# Patient Record
Sex: Female | Born: 1980 | Hispanic: No | Marital: Single | Smoking: Never smoker
Health system: Southern US, Community
[De-identification: ages and names within clinical notes are randomized; demographics above are authoritative.]

## PROBLEM LIST (undated history)

## (undated) DIAGNOSIS — D649 Anemia, unspecified: Secondary | ICD-10-CM

## (undated) HISTORY — DX: Anemia, unspecified: D64.9

---

## 2014-10-15 ENCOUNTER — Encounter: Payer: Self-pay | Admitting: Medical

## 2014-10-15 ENCOUNTER — Ambulatory Visit (INDEPENDENT_AMBULATORY_CARE_PROVIDER_SITE_OTHER): Payer: Medicaid Other | Admitting: Medical

## 2014-10-15 VITALS — BP 102/60 | HR 80 | Temp 98.4°F | Resp 16 | Ht 61.0 in | Wt 128.0 lb

## 2014-10-15 DIAGNOSIS — D539 Nutritional anemia, unspecified: Secondary | ICD-10-CM

## 2014-10-15 DIAGNOSIS — J029 Acute pharyngitis, unspecified: Secondary | ICD-10-CM

## 2014-10-15 DIAGNOSIS — Z0001 Encounter for general adult medical examination with abnormal findings: Secondary | ICD-10-CM | POA: Diagnosis not present

## 2014-10-15 DIAGNOSIS — Z0000 Encounter for general adult medical examination without abnormal findings: Secondary | ICD-10-CM

## 2014-10-15 LAB — POCT URINALYSIS DIPSTICK
BILIRUBIN UA: NEGATIVE
Blood, UA: NEGATIVE
Glucose, UA: NEGATIVE
Ketones, UA: NEGATIVE
LEUKOCYTES UA: NEGATIVE
Nitrite, UA: NEGATIVE
PH UA: 6
PROTEIN UA: NEGATIVE
Spec Grav, UA: 1.015
Urobilinogen, UA: NEGATIVE

## 2014-10-15 NOTE — Progress Notes (Signed)
Subjective:   HPI  Jacqueline Reyes is a 33 y.o. female who presents for new patient to establish care.   From [place] originally, was in [place] in refugee camp.  Came to [place], [place] 1 month ago.  Her case worker wanted to bring her in for establishing care.  She is here in the room alone, used telephone translation resources Hotline  She brought records from her recent health department visit today with [facility] showing updated vaccines, had some blood work done as well.  She reports being healthy, in her usual state of health. Has not been receiving routine medical care in her home country or in the refugee.    Concerns: She is currently on antibiotic for a broken tooth, has a dentist visit set up  She denies current sexual activity for the last 12 years, no concern for STD, had an HIV test in [place] within the past year that was negative, no vaginal complaint, periods are regular, occasionally crampy, does have a history of anemia, has used over-the-counter Advil for pain in the past during periods, no prior pregnancy  She reports some recent sore throat without other symptoms. Apparently in her culture people have their uvula cut off for fear recurrent throat infections. She denies a history of acid reflux but has had some issue with her vocal cords in the past  Reviewed their medical, surgical, family, social, medication, and allergy history and updated chart as appropriate.  Past Medical History  Diagnosis Date  . Anemia     History reviewed. No pertinent past surgical history.  History   Social History  . Marital Status: Single    Spouse Name: N/A    Number of Children: N/A  . Years of Education: N/A   Occupational History  . Not on file.   Social History Main Topics  . Smoking status: Never Smoker   . Smokeless tobacco: Not on file  . Alcohol Use: No  . Drug Use: No  . Sexual Activity: Not on file   Other Topics Concern  . Not on file   Social  History Narrative  . No narrative on file    Family History  Problem Relation Age of Onset  . Cancer Neg Hx   . Diabetes Neg Hx   . Heart disease Neg Hx   . Hyperlipidemia Neg Hx   . Stroke Neg Hx     Current outpatient prescriptions: amoxicillin (AMOXIL) 500 MG capsule, Take 500 mg by mouth 4 (four) times daily., Disp: , Rfl:   No Known Allergies    Review of Systems Constitutional: -fever, -chills, -sweats, -unexpected weight change, -decreased appetite, -fatigue Allergy: -sneezing, -itching, -congestion Dermatology: -changing moles, --rash, -lumps ENT: -runny nose, -ear pain, -sore throat, -hoarseness, -sinus pain, -teeth pain, - ringing in ears, -hearing loss, -nosebleeds Cardiology: -chest pain, -palpitations, -swelling, -difficulty breathing when lying flat, -waking up short of breath Respiratory: -cough, -shortness of breath, -difficulty breathing with exercise or exertion, -wheezing, -coughing up blood Gastroenterology: -abdominal pain, -nausea, -vomiting, -diarrhea, -constipation, -blood in stool, -changes in bowel movement, -difficulty swallowing or eating Hematology: -bleeding, -bruising  Musculoskeletal: -joint aches, -muscle aches, -joint swelling, -back pain, -neck pain, -cramping, -changes in gait Ophthalmology: denies vision changes, eye redness, itching, discharge Urology: -burning with urination, -difficulty urinating, -blood in urine, -urinary frequency, -urgency, -incontinence Neurology: -headache, -weakness, -tingling, -numbness, -memory loss, -falls, -dizziness Psychology: -depressed mood, -agitation, -sleep problems     Objective:   Physical Exam  BP 102/60 mmHg  Pulse 80  Temp(Src) 98.4 F (36.9 C) (Oral)  Resp 16  Ht 5\' 1"  (1.549 m)  Wt 128 lb (58.06 kg)  BMI 24.20 kg/m2  General appearance: alert, no distress, WD/WN. [place] female Skin: Scattered macules, tattoos right dorsal hand, tattoo left forearm, no worrisome lesions HEENT:  normocephalic, conjunctiva/corneas normal, sclerae anicteric, PERRLA, EOMi, nares patent, no discharge or erythema, pharynx normal Oral cavity: MMM, tongue normal no obvious abscess or decay, some stain present Neck: supple, no lymphadenopathy, no thyromegaly, no masses, normal ROM, no bruits Chest: non tender, normal shape and expansion Heart: RRR, normal S1, S2, no murmurs Lungs: CTA bilaterally, no wheezes, rhonchi, or rales Abdomen: +bs, soft, non tender, non distended, no masses, no hepatomegaly, no splenomegaly, no bruits Back: non tender, normal ROM, no scoliosis Musculoskeletal: upper extremities non tender, no obvious deformity, normal ROM throughout, lower extremities non tender, no obvious deformity, normal ROM throughout Extremities: no edema, no cyanosis, no clubbing Pulses: 2+ symmetric, upper and lower extremities, normal cap refill Neurological: alert, oriented x 3, CN2-12 intact, strength normal upper extremities and lower extremities, sensation normal throughout, DTRs 2+ throughout, no cerebellar signs, gait normal Psychiatric: normal affect, behavior normal, pleasant  Breast/gyn/rectal - deferred   Assessment and Plan :     Encounter Diagnoses  Name Primary?  . Encounter for health maintenance examination in adult Yes  . Deficiency anemia   . Sore throat     Physical exam - discussed healthy lifestyle, diet, exercise, preventative care, vaccinations, and addressed their concerns.  Handout given. I reviewed her vaccinations and lab orders from the health department from today's visit We discussed our role here is primary care, screening, well visits, acute and sick visits.   Will await other lab results from health dept.   Sore throat - discussed supportive care, likely viral infection, call or return if worse or not improving by the end of the week She can return at her convenience for Pap smear and routine gynecological exam or referral to gynecology at her  request Follow-up prn

## 2014-10-24 ENCOUNTER — Encounter: Payer: Self-pay | Admitting: Medical

## 2014-10-24 ENCOUNTER — Ambulatory Visit (INDEPENDENT_AMBULATORY_CARE_PROVIDER_SITE_OTHER): Payer: Medicaid Other | Admitting: Medical

## 2014-10-24 VITALS — BP 110/70 | HR 76 | Temp 98.3°F | Resp 15 | Wt 130.0 lb

## 2014-10-24 DIAGNOSIS — J312 Chronic pharyngitis: Secondary | ICD-10-CM

## 2014-10-24 DIAGNOSIS — J029 Acute pharyngitis, unspecified: Secondary | ICD-10-CM

## 2014-10-24 DIAGNOSIS — R070 Pain in throat: Secondary | ICD-10-CM

## 2014-10-24 DIAGNOSIS — G8929 Other chronic pain: Secondary | ICD-10-CM

## 2014-10-24 MED ORDER — AZITHROMYCIN 250 MG PO TABS: ORAL_TABLET | ORAL | Status: DC

## 2014-10-24 MED ORDER — OMEPRAZOLE 40 MG PO CPDR: 40.0000 mg | DELAYED_RELEASE_CAPSULE | Freq: Every day | ORAL | Status: DC

## 2014-10-24 NOTE — Progress Notes (Signed)
Subjective:  Jacqueline Reyes is a 33 y.o. female who presents for evaluation of sore throat.  She just established here for primary care recently.  Here with her friend Jacqueline Reyes who is interpreting.    She is here today for acute on chronic sore throat infection.  She notes that she has had 4-5 throat infections in the past year that requires antibiotics. This is a frequent occurrence for her. She is not sure if she tested positive for strep infection in the past.  She just came here from [place] from refugee camp.  She established here recently for primary care.  She notes that her trend is usually to have a few days of sore throat, then gets much sicker days later.  She notes several day hx/o sore throat, ear pain, low grade fever.  She denies cough, NVD, no SOB, no lympoh nodes swollen.  Has some headache.   She also notes again that in [place] and [place] the custom is to cut off the Uvula, but she wouldn't let any doctors do this.    She denies hx/o GERD ,but does eat spicy foods.  The sore throat can be any time of day.    She has not had a recent close exposure to someone with proven streptococcal pharyngitis.   She is currently taking Amoxicillin per dentist for tooth infection.  No sick contacts.  No other aggravating or relieving factors.  No other c/o.  The following portions of the patient's history were reviewed and updated as appropriate: allergies, current medications, past medical history, past social history, past surgical history and problem list.  ROS as in subjective   Objective: Filed Vitals:   10/24/14 1506  BP: 110/70  Pulse: 76  Temp: 98.3 F (36.8 C)  Resp: 15   General appearance: no distress, WD/WN HEENT: normocephalic, conjunctiva/corneas normal, sclerae anicteric, nares patent, no discharge or erythema, pharynx with mild erythema,there seems to be froth in the pharynx, but no tonsillar exudate.  Oral cavity: MMM, no lesions  Neck: supple, shoddy tender  anterior nodes, no thyromegaly Heart: RRR, normal S1, S2, no murmurs Lungs: CTA bilaterally, no wheezes, rhonchi, or rales Abdomen: +bs, soft, non tender, non distended, no masses, no hepatomegaly, no splenomegaly    Assessment and Plan: Encounter Diagnoses  Name Primary?  . Sore throat Yes  . Chronic throat pain   . Pharyngitis, chronic    We discussed her prior and current symptoms which suggest recurrent pharyngitis, recurrent tonsillitis, or possibly GERD.   She will finish current course of Amoxicillin.   Begin Omeprazole, cut back on GERD triggers.   Discussed symptomatic treatment including salt water gargles, warm fluids, rest, hydrate well, can use over-the-counter Tylenol or Ibuprofen for throat pain, fever, or malaise. If worse or not improving within 2-3 days, call or return.  We will call with strep DNA probe.

## 2014-10-25 LAB — STREP A DNA PROBE: GASP: NEGATIVE

## 2015-03-07 ENCOUNTER — Ambulatory Visit: Payer: Medicaid Other | Admitting: Family Medicine

## 2015-03-11 ENCOUNTER — Ambulatory Visit
Admission: RE | Admit: 2015-03-11 | Discharge: 2015-03-11 | Disposition: A | Discharge status: Home / Self Care | Payer: Self-pay | Source: Ambulatory Visit | Attending: Medical | Admitting: Medical

## 2015-03-11 ENCOUNTER — Ambulatory Visit (INDEPENDENT_AMBULATORY_CARE_PROVIDER_SITE_OTHER): Payer: Medicaid Other | Admitting: Medical

## 2015-03-11 ENCOUNTER — Encounter: Payer: Self-pay | Admitting: Medical

## 2015-03-11 ENCOUNTER — Ambulatory Visit: Payer: Medicaid Other | Admitting: Medical

## 2015-03-11 VITALS — BP 108/70 | HR 78 | Resp 16 | Wt 127.0 lb

## 2015-03-11 DIAGNOSIS — M546 Pain in thoracic spine: Secondary | ICD-10-CM

## 2015-03-11 DIAGNOSIS — M545 Low back pain, unspecified: Secondary | ICD-10-CM

## 2015-03-11 DIAGNOSIS — M25561 Pain in right knee: Secondary | ICD-10-CM

## 2015-03-11 DIAGNOSIS — M79671 Pain in right foot: Secondary | ICD-10-CM

## 2015-03-11 MED ORDER — CYCLOBENZAPRINE HCL 5 MG PO TABS: ORAL_TABLET | ORAL | Status: DC

## 2015-03-11 MED ORDER — NAPROXEN 375 MG PO TABS: 375.0000 mg | ORAL_TABLET | Freq: Two times a day (BID) | ORAL | Status: DC

## 2015-03-11 NOTE — Progress Notes (Signed)
Subjective: Here today for back and knee pain.   Used the Environmental education officer, interpreter [employer] [phone].    Of note, history was somewhat hard to discern as she initially noted her main concern begin back pain, but as the visit went on, each new issue such as knee or ankle or foot pain was then the most important concern, so it wasn't clear if the back and knee and foot were equally as painful or if the foot started all the issues.    Here for low back pain and burning sensation in low back x 2 weeks.   Tried to call and couldn't get visit or throughout phone line.  Denies recent injury or trauma or fall, but she notes when in [place] in refugee camp had to lift heavy things, hard work Electrical engineer.  Has had back pain underlying for a few years, but the last 2 weeks has had the new burning sensation in low back across the back in general.  Has used medication for the back in the past in [place] as well as a cream.   No imaging in the [place], but had xray in [place], was considering MRI.  No prior PT.  Working currently, but no heavy lifting.    From what I gather, she had some ankle injury in [place] and may have re injured recently with twisting funny.  Since then has had right foot and knee pain.  She doesn't specifically relate this to the back although she is guarded somewhat putting weight on the right leg.   Still is still ambulating ok and working although with pain.   She notes right foot pains under ankle daily including in the morning, some pain with right foot and ankle ROM.  Pains with anterior knee with walking, feels a click and says it seems to move out of place.   Only had swelling with fall few weeks ago, this resolved.  No other aggravating or relieving factors. No other complaint.   Objective: BP 108/70 mmHg  Pulse 78  Resp 16  Wt 127 lb (57.607 kg)  General appearance: alert, no distress, WD/WN, [place] female Skin: no erythema, no ecchymosis Abdomen: +bs, soft, non tender,  non distended, no masses, no hepatomegaly, no splenomegaly Back: tender low thoracic spine and paraspinal region in thoracic region throughout, no other lumbar or upper back tenderness, no SI joint tenderness, no scoliosis MSK: tender over right patella, pain with knee extension, mild pain with right knee ROM in general, but no swelling, no other deformity, no laxity and she is able to ambulate without obvious difficulty.  Tender over right foot mid foot inferiorly and anterior ankle, mild pain noted with ankle ROM, but no laxity, nontender otherwise, no deformity, rest of bilat leg and arm exam unremarkable Ext: no edema Legs neurovascularly intact, arms neurovascularly intact   Assessment: Encounter Diagnoses  Name Primary?  . Midline thoracic back pain Yes  . Bilateral low back pain without sciatica   . Right knee pain   . Foot pain, right      Plan: I suspect musculoskeletal thoracolumbar pain, right patellar tendonitis or patellofemoral syndrome and possible sequela from ankle sprain, but it is unclear.  Will send for xrays.   Medications today include naprosyn, flexeril and likely referral to PT.  We agree to mail her results and next steps/recommendations.   She understands and agrees with plan.  She reads english and speaks some english.

## 2015-03-12 ENCOUNTER — Encounter: Payer: Self-pay | Admitting: Medical

## 2015-03-12 ENCOUNTER — Encounter: Payer: Self-pay | Admitting: Family Medicine

## 2015-03-18 ENCOUNTER — Telehealth: Payer: Self-pay | Admitting: Family Medicine

## 2015-03-18 NOTE — Telephone Encounter (Signed)
Patient came by the office wanting a detail explanation about her xray results. I went over the xray results with her. I explain to her that we tried to refer her PT but Medicaid doesn't cover her for PT so Jacqueline Reyes wanted to refer her to Orthopedics. After, speaking to someone who could translate she understood everything and I gave her all of the appointment details. Jacqueline Reyes, [name]  03/31/15 @ 215 PM WITH DR. Durward Fortes

## 2015-04-02 ENCOUNTER — Ambulatory Visit: Payer: Medicaid Other | Attending: Orthopaedic Surgery

## 2015-04-02 DIAGNOSIS — M228X1 Other disorders of patella, right knee: Secondary | ICD-10-CM | POA: Diagnosis not present

## 2015-04-02 DIAGNOSIS — M25561 Pain in right knee: Secondary | ICD-10-CM | POA: Diagnosis present

## 2015-04-02 NOTE — Therapy (Signed)
[place], [place], [address] Phone: [phone]   Fax:  [phone]  Physical Therapy Evaluation  Patient Details  Name: Jacqueline Reyes MRN: [id number] Date of Birth: 1981/09/17 Referring Provider:  Garald Balding, MD  Encounter Date: 04/02/2015      PT End of Session - 04/02/15 1110    Visit Number 1   Number of Visits 7   Date for PT Re-Evaluation 05/02/15  Date extended as she has to decide on payment options and this may take some days to determine   Authorization Type Medicaid   Authorization - Visit Number 1   Authorization - Number of Visits 1   PT Start Time 1000   PT Stop Time 1100   PT Time Calculation (min) 60 min   Activity Tolerance Patient tolerated treatment well   Behavior During Therapy [facility] for tasks assessed/performed      Past Medical History  Diagnosis Date  . Anemia     No past surgical history on file.  There were no vitals filed for this visit.  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  She also fell 6 weeks ago  on floor   Limitations Standing;Walking;House hold activities  work as sit and standing   How long can you sit comfortably? 1 hour   How long can you stand comfortably? stand 2 hours   Diagnostic tests xray: negative   Patient Stated Goals Decreased pain   Currently in Pain? No/denies   Multiple Pain Sites No            [facility] PT Assessment - 04/02/15 1013    Assessment   Medical Diagnosis RT knee pain   Onset Date --  3 years ago   Prior Therapy No   Precautions   Precautions None   Restrictions   Weight Bearing Restrictions No   Balance Screen   Has the patient fallen in the  past 6 months Yes   How many times? 1   Has the patient had a decrease in activity level because of a fear of falling?  No   Is the patient reluctant to leave their home because of a fear of falling?  No   Prior Function   Level of Independence Independent with basic ADLs   Cognition   Overall Cognitive Status Within Functional Limits for tasks assessed   ROM / Strength   AROM / PROM / Strength AROM;Strength   AROM   AROM Assessment Site Knee   Right/Left Knee Right;Left   Right Knee Extension 0   Right Knee Flexion 180   Left Knee Extension 0   Left Knee Flexion 180   Strength   Overall Strength Comments Normal all LE tests   Palpation   Palpation Tender around RT patella with tilt of patella   Ambulation/Gait   Gait Comments Normal                           PT Education - 04/02/15 1109    Education provided Yes   Education Details POC and HEP. Also medicaid limitations and need for self pay  if she wants to continue PT. She said she would ask her sponser organization to see if they will pay for PT   Person(s) Educated Patient   Methods Explanation;Demonstration;Verbal cues;Handout   Comprehension Verbalized understanding;Returned demonstration             PT Long Term Goals - 04/02/15 1115    PT LONG TERM GOAL #1   Title Independent with all hEP issued as of last visit   Time 3   Period Weeks   Status New   PT LONG TERM GOAL #2   Title She will report pain decreased 50% with activity   Time 3   Period Weeks   Status New               Plan - 04/02/15 1111    Clinical Impression Statement She has normal strength and range with no swelling or incr temp. She does have some patella tracking issues. She may benefit from more PT but she is not able to pay. If she can get coverage she  will be seen for 6 total visits to progres HEP and try tape to help with tracking   Pt will benefit from skilled therapeutic intervention in order to improve  on the following deficits Pain   Rehab Potential Good   PT Frequency 2x / week   PT Duration 3 weeks   PT Treatment/Interventions Cryotherapy;Therapeutic exercise;Patient/family education;Manual techniques   PT Next Visit Plan Will progress exercise and try taping for patella alignment   PT Home Exercise Plan Strength quads   Consulted and Agree with Plan of Care Patient         Problem List There are no active problems to display for this patient.   Darrel Hoover PT 04/02/2015, 11:20 AM  [facility] [address] [place], [place], [address] Phone: [phone]   Fax:  [phone]

## 2015-04-02 NOTE — Patient Instructions (Signed)
Issued exercises from cabinet for SLR supine and side lye and LAQ and wall sits. All with hold time for 5-10 sec and 10-15 reps 1x/day, 2x/day on days you don't work

## 2015-04-24 ENCOUNTER — Ambulatory Visit: Payer: Medicaid Other | Attending: Orthopaedic Surgery | Admitting: Physical Therapy

## 2015-04-24 DIAGNOSIS — M228X1 Other disorders of patella, right knee: Secondary | ICD-10-CM

## 2015-04-24 DIAGNOSIS — M25561 Pain in right knee: Secondary | ICD-10-CM | POA: Diagnosis present

## 2015-04-24 NOTE — Therapy (Addendum)
[place], [place], [address] Phone: [phone]   Fax:  [phone]  Physical Therapy Treatment  Patient Details  Name: Jacqueline Reyes MRN: [id number] Date of Birth: 1980-11-30 Referring Provider:  Carlena Hurl, PA-C  Encounter Date: 04/24/2015      PT End of Session - 04/24/15 1411    Visit Number 2   Number of Visits 7   Date for PT Re-Evaluation 05/02/15   PT Start Time 0935   PT Stop Time 8416   PT Time Calculation (min) 60 min   Activity Tolerance Patient tolerated treatment well      Past Medical History  Diagnosis Date  . Anemia     No past surgical history on file.  There were no vitals filed for this visit.  Visit Diagnosis:  Pain in knee joint, right  Patellar tracking disorder of right knee      Subjective Assessment - 04/24/15 0946    Pain Location Knee   Pain Orientation Right   Pain Descriptors / Indicators Aching  pain, Rt lateral warm   Pain Type Chronic pain  3 years   Pain Radiating Towards lateral thigh   Pain Onset Other (comment)  3 years   Aggravating Factors  standing 8 hours, varicose veins type pain too   Pain Relieving Factors brace, bicycle   Multiple Pain Sites No                         [facility] Adult PT Treatment/Exercise - 04/24/15 0930    Knee/Hip Exercises: Stretches   Passive Hamstring Stretch 3 reps;30 seconds   ITB Stretch --  3 reps, 20 seconds each.  Tried 2 others sidelying,   Knee/Hip Exercises: Standing   Wall Squat 10 seconds;10 reps  cues pillow squeeze and heel press to increase comfort   Cryotherapy   Number Minutes Cryotherapy 10 Minutes   Cryotherapy Location --  knees   Type of Cryotherapy --  cold pack   Manual Therapy   McConnell --  for lateral tracking patella instruction , patient did corre                PT Education - 04/24/15 1032    Education provided Yes   Education Details how to  tape knees, IT band stretch   Person(s) Educated Patient   Methods Explanation;Demonstration;Verbal cues;Tactile cues;Handout   Comprehension Verbalized understanding;Returned demonstration             PT Long Term Goals - 04/24/15 1416    PT LONG TERM GOAL #1   Title Independent with all hEP issued as of last visit   Baseline minor cues with wall sits.   Time 3   Period Weeks   Status On-going   PT LONG TERM GOAL #2   Title She will report pain decreased 50% with activity   Time 3   Period Weeks   Status On-going               Plan - 04/24/15 1416    Consulted and Agree with Plan of Care Patient        Problem List There are no active problems to display for this patient.   [facility] 04/24/2015, 2:20 PM  [facility] [address] [place], [place], [address] Phone: [phone]   Fax:  [phone]  Melvenia Needles, PTA 04/24/2015 2:20 PM Phone: [phone] Fax: [phone]  PHYSICAL THERAPY  DISCHARGE SUMMARY  Visits from Start of Care: 2  Current functional level related to goals / functional outcomes: Unknown   Remaining deficits: Unknown   Education / Equipment: HEP Plan:                                                    Patient goals were not met. Patient is being discharged due to not returning since the last visit.  ?????   Darrel Hoover , PT   09/30/15                       2:14 PM

## 2015-07-22 ENCOUNTER — Encounter (HOSPITAL_COMMUNITY): Payer: Self-pay

## 2015-07-22 ENCOUNTER — Emergency Department (HOSPITAL_COMMUNITY)
Admission: EM | Admit: 2015-07-22 | Discharge: 2015-07-22 | Disposition: A | Discharge status: Home / Self Care | Payer: Medicaid Other | Attending: Emergency Medicine | Admitting: Emergency Medicine

## 2015-07-22 DIAGNOSIS — Z862 Personal history of diseases of the blood and blood-forming organs and certain disorders involving the immune mechanism: Secondary | ICD-10-CM | POA: Insufficient documentation

## 2015-07-22 DIAGNOSIS — Z791 Long term (current) use of non-steroidal anti-inflammatories (NSAID): Secondary | ICD-10-CM | POA: Insufficient documentation

## 2015-07-22 DIAGNOSIS — Z792 Long term (current) use of antibiotics: Secondary | ICD-10-CM | POA: Insufficient documentation

## 2015-07-22 DIAGNOSIS — H6092 Unspecified otitis externa, left ear: Secondary | ICD-10-CM | POA: Insufficient documentation

## 2015-07-22 MED ORDER — CIPROFLOXACIN-DEXAMETHASONE 0.3-0.1 % OT SUSP
4.0000 [drp] | Freq: Once | OTIC | Status: AC
  Administered 2015-07-22: 4 [drp] via OTIC
  Filled 2015-07-22: qty 7.5

## 2015-07-22 MED ORDER — IBUPROFEN 600 MG PO TABS
600.0000 mg | ORAL_TABLET | Freq: Four times a day (QID) | ORAL | Status: DC | PRN
Start: 2015-07-22 — End: 2016-06-19

## 2015-07-22 MED ORDER — TRAMADOL HCL 50 MG PO TABS: 50.0000 mg | ORAL_TABLET | Freq: Four times a day (QID) | ORAL | Status: DC | PRN

## 2015-07-22 NOTE — ED Notes (Signed)
Pt reports left ear pain and drainage that began last night.  Pt reports yellow drainage from left ear and reports the pain came on suddenly.  Pt denies going swimming or any trauma.

## 2015-07-22 NOTE — ED Notes (Signed)
Messaged pharmacy for medication 

## 2015-07-22 NOTE — Discharge Instructions (Signed)
Apply 4 drops of the eardrops twice a day. Ibuprofen and tramadol for pain. Follow-up with primary care doctor for recheck. Return if worsening  Otitis Externa Otitis externa is a bacterial or fungal infection of the outer ear canal. This is the area from the eardrum to the outside of the ear. Otitis externa is sometimes called "swimmer's ear." CAUSES  Possible causes of infection include:  Swimming in dirty water.  Moisture remaining in the ear after swimming or bathing.  Mild injury (trauma) to the ear.  Objects stuck in the ear (foreign body).  Cuts or scrapes (abrasions) on the outside of the ear. SIGNS AND SYMPTOMS  The first symptom of infection is often itching in the ear canal. Later signs and symptoms may include swelling and redness of the ear canal, ear pain, and yellowish-white fluid (pus) coming from the ear. The ear pain may be worse when pulling on the earlobe. DIAGNOSIS  Your health care provider will perform a physical exam. A sample of fluid may be taken from the ear and examined for bacteria or fungi. TREATMENT  Antibiotic ear drops are often given for 10 to 14 days. Treatment may also include pain medicine or corticosteroids to reduce itching and swelling. HOME CARE INSTRUCTIONS   Apply antibiotic ear drops to the ear canal as prescribed by your health care provider.  Take medicines only as directed by your health care provider.  If you have diabetes, follow any additional treatment instructions from your health care provider.  Keep all follow-up visits as directed by your health care provider. PREVENTION   Keep your ear dry. Use the corner of a towel to absorb water out of the ear canal after swimming or bathing.  Avoid scratching or putting objects inside your ear. This can damage the ear canal or remove the protective wax that lines the canal. This makes it easier for bacteria and fungi to grow.  Avoid swimming in lakes, polluted water, or poorly  chlorinated pools.  You may use ear drops made of rubbing alcohol and vinegar after swimming. Combine equal parts of white vinegar and alcohol in a bottle. Put 3 or 4 drops into each ear after swimming. SEEK MEDICAL CARE IF:   You have a fever.  Your ear is still red, swollen, painful, or draining pus after 3 days.  Your redness, swelling, or pain gets worse.  You have a severe headache.  You have redness, swelling, pain, or tenderness in the area behind your ear. MAKE SURE YOU:   Understand these instructions.  Will watch your condition.  Will get help right away if you are not doing well or get worse. Document Released: 11/01/2005 Document Revised: 03/18/2014 Document Reviewed: 11/18/2011 [facility] Patient Information 2015 [place], [place]. This information is not intended to replace advice given to you by your health care provider. Make sure you discuss any questions you have with your health care provider.

## 2015-07-22 NOTE — ED Provider Notes (Signed)
CSN: [id number]     Arrival date & time 07/22/15  1000 History  This chart was scribed for non-physician practitioner, Renold Genta, PA-C, working with Orpah Greek, MD, by Stephania Fragmin, ED Scribe. This patient was seen in room TR06C/TR06C and the patient's care was started at 10:42 AM.    Chief Complaint  Patient presents with  . Otalgia   The history is provided by the patient. No language interpreter was used.    HPI Comments: Jacqueline Reyes is a 34 y.o. female with a history of anemia, who presents to the Emergency Department complaining of gradual-onset, constant, severe, worsening, left ear pain and swelling that began yesterday evening. She also notes associated discharge from the ear. Patient complains of difficulty sleeping yesterday secondary to the pain. She states she has taken old hydrocodone tablets, previously prescribed for dental pain, with only temporary relief. She denies fever, sore throat, rhinorrhea, or any right ear symptoms.  Past Medical History  Diagnosis Date  . Anemia    History reviewed. No pertinent past surgical history. Family History  Problem Relation Age of Onset  . Cancer Neg Hx   . Diabetes Neg Hx   . Heart disease Neg Hx   . Hyperlipidemia Neg Hx   . Stroke Neg Hx    Social History  Substance Use Topics  . Smoking status: Never Smoker   . Smokeless tobacco: None  . Alcohol Use: No   OB History    No data available     Review of Systems  Constitutional: Negative for fever.  HENT: Positive for ear discharge and ear pain. Negative for rhinorrhea and sore throat.     Allergies  Review of patient's allergies indicates no known allergies.  Home Medications   Prior to Admission medications   Medication Sig Start Date End Date Taking? Authorizing Provider  amoxicillin (AMOXIL) 500 MG capsule Take 500 mg by mouth 4 (four) times daily.    Historical Provider, MD  cyclobenzaprine (FLEXERIL) 5 MG tablet 1 tablet either QHS prn or  up to BID for back spasm 03/11/15   Camelia Eng Tysinger, PA-C  naproxen (NAPROSYN) 375 MG tablet Take 1 tablet (375 mg total) by mouth 2 (two) times daily with a meal. 03/11/15   Camelia Eng Tysinger, PA-C  omeprazole (PRILOSEC) 40 MG capsule Take 1 capsule (40 mg total) by mouth daily. Patient not taking: Reported on 03/11/2015 10/24/14   Camelia Eng Tysinger, PA-C   BP 131/84 mmHg  Pulse 86  Temp(Src) 98 F (36.7 C) (Oral)  Resp 18  Ht 5\' 3"  (1.6 m)  Wt 125 lb (56.7 kg)  BMI 22.15 kg/m2  SpO2 100% Physical Exam  Constitutional: She is oriented to person, place, and time. She appears well-developed and well-nourished. No distress.  HENT:  Head: Normocephalic and atraumatic.  No obvious swelling to the outer helix or pinna of the left ear. Tender to palpation over tragus. Ear canal is edematous on the left. Purulent drainage. Unable to visualize TM. No tenderness of her mastoid. Normal oropharynx. Normal right ear and ear canal.  Eyes: Conjunctivae and EOM are normal.  Neck: Neck supple. No tracheal deviation present.  Cardiovascular: Normal rate, regular rhythm and normal heart sounds.   Pulmonary/Chest: Effort normal and breath sounds normal. No respiratory distress. She has no wheezes. She has no rales.  Musculoskeletal: Normal range of motion.  Neurological: She is alert and oriented to person, place, and time.  Skin: Skin is warm and dry.  Psychiatric: She has a normal mood and affect. Her behavior is normal.  Nursing note and vitals reviewed.   ED Course  Procedures (including critical care time)  DIAGNOSTIC STUDIES: Oxygen Saturation is 100% on RA, normal by my interpretation.    COORDINATION OF CARE: 10:45 AM - Suspect otitis externa. Discussed treatment plan with pt at bedside which includes application of ear wick with antibiotics, and discharge home with Rx antibiotic ear drops. Will give work note. Pt verbalized understanding and agreed to plan.   MDM   Final diagnoses:   Otitis externa, left   Patient with left ear canal swelling and purulent drainage. No evidence of ear infection or cellulitis. No tenderness of her mastoid. She is afebrile, normal vital signs, nontoxic appearing. No evidence of malignant otitis externa. She is nondiabetic. Ear wick placed, Ciprodex administered in emergency department. We'll discharge home with ear drops and follow-up as needed. Tramadol to provide for pain.  Filed Vitals:   07/22/15 1005 07/22/15 1144  BP: 131/84 117/82  Pulse: 86 83  Temp: 98 F (36.7 C) 98.9 F (37.2 C)  TempSrc: Oral Oral  Resp: 18 16  Height: 5\' 3"  (1.6 m)   Weight: 125 lb (56.7 kg)   SpO2: 100% 100%    I personally performed the services described in this documentation, which was scribed in my presence. The recorded information has been reviewed and is accurate.      Jeannett Senior, PA-C 07/22/15 [place], MD 07/22/15 1239

## 2015-07-29 ENCOUNTER — Telehealth: Payer: Self-pay | Admitting: Medical

## 2015-07-29 ENCOUNTER — Telehealth: Payer: Self-pay | Admitting: Family Medicine

## 2015-07-29 ENCOUNTER — Emergency Department (HOSPITAL_COMMUNITY)
Admission: EM | Admit: 2015-07-29 | Discharge: 2015-07-29 | Disposition: A | Discharge status: Home / Self Care | Payer: Medicaid Other | Attending: Emergency Medicine | Admitting: Emergency Medicine

## 2015-07-29 ENCOUNTER — Encounter (HOSPITAL_COMMUNITY): Payer: Self-pay | Admitting: Emergency Medicine

## 2015-07-29 DIAGNOSIS — Z791 Long term (current) use of non-steroidal anti-inflammatories (NSAID): Secondary | ICD-10-CM | POA: Insufficient documentation

## 2015-07-29 DIAGNOSIS — Z862 Personal history of diseases of the blood and blood-forming organs and certain disorders involving the immune mechanism: Secondary | ICD-10-CM | POA: Insufficient documentation

## 2015-07-29 DIAGNOSIS — H6092 Unspecified otitis externa, left ear: Secondary | ICD-10-CM | POA: Insufficient documentation

## 2015-07-29 DIAGNOSIS — Z792 Long term (current) use of antibiotics: Secondary | ICD-10-CM | POA: Insufficient documentation

## 2015-07-29 NOTE — Telephone Encounter (Signed)
ED letter sent

## 2015-07-29 NOTE — Telephone Encounter (Signed)
Please get in touch with the patient about recent visit to the emergency department.  Remind them to use [place] first for non emergency issues.  The emergency dept is expensive, should be used primarily for emergencies, and they should be encouraged to come here for non emergency problems.  This saves every body money, saves them time, and is better for continuity of care.  If they are ever unsure, they can always call here first, unless after hours or weekends.    This is twice recently Jacqueline Reyes went to ED for some thing non emergent, and today we had plenty of openings. If Jacqueline Reyes does this again, Jacqueline Reyes will be dismissed from our practice.

## 2015-07-29 NOTE — ED Provider Notes (Signed)
CSN: [id number]     Arrival date & time 07/29/15  4680 History  This chart was scribed for non-physician practitioner, Zacarias Pontes, PA-C working with Nat Christen, MD, by Erling Conte, ED Scribe. This patient was seen in room TR06C/TR06C and the patient's care was started at 10:16 AM.      Chief Complaint  Patient presents with  . Follow-up    The history is provided by the patient. No language interpreter was used.    HPI Comments: Jacqueline Reyes is a 34 y.o. female with a h/o anemia who presents to the Emergency Department for a follow up of her left ear infection. Pt was seen 7 days ago on 07/22/15 and an ear wick was placed in her left ear. At the visit on 07/22/15 pt was having left ear pain, swelling and discharge from her left ear. She was prescribed Ibuprofen, tramadol and Ciprodex which she has been compliant with. She denies any continued pain or drainage from the ear. She denies any rhinorrhea, sore throat, fever, chills, chest pain, SOB, difficulty urinating, dysuria, hematuria, numbness, tingling, weakness, myalgias, or rashes. She is here for ear wick removal.    Past Medical History  Diagnosis Date  . Anemia    History reviewed. No pertinent past surgical history. Family History  Problem Relation Age of Onset  . Cancer Neg Hx   . Diabetes Neg Hx   . Heart disease Neg Hx   . Hyperlipidemia Neg Hx   . Stroke Neg Hx    Social History  Substance Use Topics  . Smoking status: Never Smoker   . Smokeless tobacco: None  . Alcohol Use: No   OB History    No data available     Review of Systems  Constitutional: Negative for fever and chills.  HENT: Negative for ear discharge, ear pain, rhinorrhea and sore throat.   Respiratory: Negative for shortness of breath.   Cardiovascular: Negative for chest pain.  Gastrointestinal: Negative for nausea, vomiting, abdominal pain, diarrhea and constipation.  Genitourinary: Negative for dysuria, hematuria and difficulty  urinating.  Musculoskeletal: Negative for myalgias and arthralgias.  Skin: Negative for rash.  Allergic/Immunologic: Negative for immunocompromised state.  Neurological: Negative for weakness and numbness.  10 Systems reviewed and are negative for acute change except as noted in the HPI.     Allergies  Review of patient's allergies indicates no known allergies.  Home Medications   Prior to Admission medications   Medication Sig Start Date End Date Taking? Authorizing Provider  amoxicillin (AMOXIL) 500 MG capsule Take 500 mg by mouth 4 (four) times daily.    Historical Provider, MD  cyclobenzaprine (FLEXERIL) 5 MG tablet 1 tablet either QHS prn or up to BID for back spasm 03/11/15   Camelia Eng Tysinger, PA-C  ibuprofen (ADVIL,MOTRIN) 600 MG tablet Take 1 tablet (600 mg total) by mouth every 6 (six) hours as needed. 07/22/15   Tatyana Kirichenko, PA-C  naproxen (NAPROSYN) 375 MG tablet Take 1 tablet (375 mg total) by mouth 2 (two) times daily with a meal. 03/11/15   Camelia Eng Tysinger, PA-C  omeprazole (PRILOSEC) 40 MG capsule Take 1 capsule (40 mg total) by mouth daily. Patient not taking: Reported on 03/11/2015 10/24/14   Camelia Eng Tysinger, PA-C  traMADol (ULTRAM) 50 MG tablet Take 1 tablet (50 mg total) by mouth every 6 (six) hours as needed. 07/22/15   Tatyana Kirichenko, PA-C   BP 130/81 mmHg  Pulse 71  Temp(Src) 97.6 F (36.4 C) (Oral)  Resp 16  SpO2 100%  LMP 07/28/2015 Physical Exam  Constitutional: She is oriented to person, place, and time. Vital signs are normal. She appears well-developed and well-nourished.  Non-toxic appearance. No distress.  Afebrile, nontoxic, NAD  HENT:  Head: Normocephalic and atraumatic.  Right Ear: Tympanic membrane and ear canal normal.  Left Ear: Tympanic membrane and ear canal normal.  Mouth/Throat: Mucous membranes are normal.  Left Ear with ear wick, after removal canal with no drainage, erythema or swelling. TM clear Right ear clear  Eyes:  Conjunctivae and EOM are normal. Right eye exhibits no discharge. Left eye exhibits no discharge.  Neck: Normal range of motion. Neck supple.  Cardiovascular: Normal rate.   Pulmonary/Chest: Effort normal. No respiratory distress.  Abdominal: Normal appearance. She exhibits no distension.  Musculoskeletal: Normal range of motion.  Neurological: She is alert and oriented to person, place, and time. She has normal strength. No sensory deficit.  Skin: Skin is warm, dry and intact. No rash noted.  Psychiatric: She has a normal mood and affect. Her behavior is normal.  Nursing note and vitals reviewed.   ED Course  FOREIGN BODY REMOVAL Date/Time: 07/29/2015 10:17 AM Performed by: Shann Medal Autumn Pruitt Authorized by: Zacarias Pontes Consent: Verbal consent obtained. Risks and benefits: risks, benefits and alternatives were discussed Consent given by: patient Patient understanding: patient states understanding of the procedure being performed Patient consent: the patient's understanding of the procedure matches consent given Patient identity confirmed: verbally with patient Body area: ear Location details: left ear Patient sedated: no Patient restrained: no Patient cooperative: yes Localization method: ENT speculum Removal mechanism: forceps Complexity: simple 1 objects recovered. Objects recovered: ear wick Post-procedure assessment: foreign body removed Patient tolerance: Patient tolerated the procedure well with no immediate complications   (including critical care time) Labs Review Labs Reviewed - No data to display  Imaging Review No results found. I have personally reviewed and evaluated these images and lab results as part of my medical decision-making.   EKG Interpretation None      MDM   Final diagnoses:  Otitis externa, left    34 y.o. female here for earwick removal. Removed without issue. Otitis externa resolved. Will have her f/up with PCP as  needed. I explained the diagnosis and have given explicit precautions to return to the ER including for any other new or worsening symptoms. The patient understands and accepts the medical plan as it's been dictated and I have answered their questions. Discharge instructions concerning home care and prescriptions have been given. The patient is STABLE and is discharged to home in good condition.   I personally performed the services described in this documentation, which was scribed in my presence. The recorded information has been reviewed and is accurate.  BP 130/81 mmHg  Pulse 71  Temp(Src) 97.6 F (36.4 C) (Oral)  Resp 16  SpO2 100%  LMP 07/28/2015  No orders of the defined types were placed in this encounter.       Dewitt Hoes Camprubi-Soms, PA-C 07/29/15 [place], MD 07/29/15 1247

## 2015-07-29 NOTE — Discharge Instructions (Signed)
Finish your antibiotic drops today. Use your pain medications as needed. Follow up with your regular doctor as needed.    Ear Drops You have been diagnosed with a condition requiring you to put drops of medicine into your outer ear. HOME CARE INSTRUCTIONS   Put drops in the affected ear as instructed. After putting the drops in, you will need to lie down with the affected ear facing up for ten minutes so the drops will remain in the ear canal and run down and fill the canal. Continue using the ear drops for as long as directed by your health care provider.  Prior to getting up, put a cotton ball gently in your ear canal. Leave enough of the cotton ball out so it can be easily removed. Do not attempt to push this down into the canal with a cotton-tipped swab or other instrument.  Do not irrigate or wash out your ears if you have had a perforated eardrum or mastoid surgery, or unless instructed to do so by your health care provider.  Keep appointments with your health care provider as instructed.  Finish all medicine, or use for the length of time prescribed by your health care provider. Continue the drops even if your problem seems to be doing well after a couple days, or continue as instructed. SEEK MEDICAL CARE IF:  You become worse or develop increasing pain.  You notice any unusual drainage from your ear (particularly if the drainage has a bad smell).  You develop hearing difficulties.  You experience a serious form of dizziness in which you feel as if the room is spinning, and you feel nauseated (vertigo).  The outside of your ear becomes red or swollen or both. This may be a sign of an allergic reaction. MAKE SURE YOU:   Understand these instructions.  Will watch your condition.  Will get help right away if you are not doing well or get worse. Document Released: 10/26/2001 Document Revised: 11/06/2013 Document Reviewed: 05/29/2013 [facility] Patient Information 2015 [place],  [place]. This information is not intended to replace advice given to you by your health care provider. Make sure you discuss any questions you have with your health care provider.  Otitis Externa Otitis externa is a bacterial or fungal infection of the outer ear canal. This is the area from the eardrum to the outside of the ear. Otitis externa is sometimes called "swimmer's ear." CAUSES  Possible causes of infection include:  Swimming in dirty water.  Moisture remaining in the ear after swimming or bathing.  Mild injury (trauma) to the ear.  Objects stuck in the ear (foreign body).  Cuts or scrapes (abrasions) on the outside of the ear. SIGNS AND SYMPTOMS  The first symptom of infection is often itching in the ear canal. Later signs and symptoms may include swelling and redness of the ear canal, ear pain, and yellowish-white fluid (pus) coming from the ear. The ear pain may be worse when pulling on the earlobe. DIAGNOSIS  Your health care provider will perform a physical exam. A sample of fluid may be taken from the ear and examined for bacteria or fungi. TREATMENT  Antibiotic ear drops are often given for 10 to 14 days. Treatment may also include pain medicine or corticosteroids to reduce itching and swelling. HOME CARE INSTRUCTIONS   Apply antibiotic ear drops to the ear canal as prescribed by your health care provider.  Take medicines only as directed by your health care provider.  If you have  diabetes, follow any additional treatment instructions from your health care provider.  Keep all follow-up visits as directed by your health care provider. PREVENTION   Keep your ear dry. Use the corner of a towel to absorb water out of the ear canal after swimming or bathing.  Avoid scratching or putting objects inside your ear. This can damage the ear canal or remove the protective wax that lines the canal. This makes it easier for bacteria and fungi to grow.  Avoid swimming in lakes,  polluted water, or poorly chlorinated pools.  You may use ear drops made of rubbing alcohol and vinegar after swimming. Combine equal parts of white vinegar and alcohol in a bottle. Put 3 or 4 drops into each ear after swimming. SEEK MEDICAL CARE IF:   You have a fever.  Your ear is still red, swollen, painful, or draining pus after 3 days.  Your redness, swelling, or pain gets worse.  You have a severe headache.  You have redness, swelling, pain, or tenderness in the area behind your ear. MAKE SURE YOU:   Understand these instructions.  Will watch your condition.  Will get help right away if you are not doing well or get worse. Document Released: 11/01/2005 Document Revised: 03/18/2014 Document Reviewed: 11/18/2011 [facility] Patient Information 2015 [place], [place]. This information is not intended to replace advice given to you by your health care provider. Make sure you discuss any questions you have with your health care provider.

## 2015-07-29 NOTE — ED Notes (Signed)
Pt was seen here 9/6 for left ear infection.Returns today for wick removal.

## 2015-12-29 ENCOUNTER — Encounter: Payer: Self-pay | Admitting: Medical

## 2016-06-19 ENCOUNTER — Ambulatory Visit (HOSPITAL_COMMUNITY)
Admission: EM | Admit: 2016-06-19 | Discharge: 2016-06-19 | Disposition: A | Discharge status: Home / Self Care | Payer: BLUE CROSS/BLUE SHIELD | Attending: Emergency Medicine | Admitting: Emergency Medicine

## 2016-06-19 ENCOUNTER — Ambulatory Visit (INDEPENDENT_AMBULATORY_CARE_PROVIDER_SITE_OTHER): Payer: BLUE CROSS/BLUE SHIELD

## 2016-06-19 DIAGNOSIS — M6752 Plica syndrome, left knee: Secondary | ICD-10-CM

## 2016-06-19 DIAGNOSIS — M2242 Chondromalacia patellae, left knee: Secondary | ICD-10-CM | POA: Diagnosis not present

## 2016-06-19 MED ORDER — DICLOFENAC SODIUM 75 MG PO TBEC: 75.0000 mg | DELAYED_RELEASE_TABLET | Freq: Two times a day (BID) | ORAL | 0 refills | Status: DC | PRN

## 2016-06-19 NOTE — ED Provider Notes (Signed)
[place]    CSN: [id number] Arrival date & time: 06/19/16  1201  First Provider Contact:  First MD Initiated Contact with Patient 06/19/16 1310        History   Chief Complaint Chief Complaint  Patient presents with  . Knee Pain    HPI Jacqueline Reyes is a 35 y.o. female.   HPI  Patient presents today with left knee pain.  States that pain was sudden onset yesterday.  Denies specific injury.  Has had some chronic popping/grinding PF joint but no pain before onset.  Aggravated with stairs, squatting.  Denies any true mechanical symptoms.  Has had formal PT for both knees due to question of right knee patellofemoral instability.  No knee swelling.  No lumbar spine, hip or radicular component. Has taken ibuprofen without much relief. Denies fever, chills.   Past Medical History:  Diagnosis Date  . Anemia     There are no active problems to display for this patient.   No past surgical history on file.  OB History    No data available       Home Medications    Prior to Admission medications   Medication Sig Start Date End Date Taking? Authorizing Provider  amoxicillin (AMOXIL) 500 MG capsule Take 500 mg by mouth 4 (four) times daily.    Historical Provider, MD  cyclobenzaprine (FLEXERIL) 5 MG tablet 1 tablet either QHS prn or up to BID for back spasm 03/11/15   Camelia Eng Tysinger, PA-C  ibuprofen (ADVIL,MOTRIN) 600 MG tablet Take 1 tablet (600 mg total) by mouth every 6 (six) hours as needed. 07/22/15   Tatyana Kirichenko, PA-C  naproxen (NAPROSYN) 375 MG tablet Take 1 tablet (375 mg total) by mouth 2 (two) times daily with a meal. 03/11/15   Camelia Eng Tysinger, PA-C  omeprazole (PRILOSEC) 40 MG capsule Take 1 capsule (40 mg total) by mouth daily. Patient not taking: Reported on 03/11/2015 10/24/14   Camelia Eng Tysinger, PA-C  traMADol (ULTRAM) 50 MG tablet Take 1 tablet (50 mg total) by mouth every 6 (six) hours as needed. 07/22/15   Jeannett Senior, PA-C    Family  History Family History  Problem Relation Age of Onset  . Cancer Neg Hx   . Diabetes Neg Hx   . Heart disease Neg Hx   . Hyperlipidemia Neg Hx   . Stroke Neg Hx     Social History Social History  Substance Use Topics  . Smoking status: Never Smoker  . Smokeless tobacco: Not on file  . Alcohol use No     Allergies   Review of patient's allergies indicates no known allergies.   Review of Systems Review of Systems  Constitutional: Negative.   HENT: Negative.   Eyes: Negative.   Respiratory: Negative.   Cardiovascular: Negative.   Gastrointestinal: Negative.   Genitourinary: Negative.   Skin: Negative.   Neurological: Negative.   Psychiatric/Behavioral: Negative.      Physical Exam Triage Vital Signs ED Triage Vitals [06/19/16 1254]  Enc Vitals Group     BP 118/79     Pulse Rate 64     Resp 12     Temp 97.3 F (36.3 C)     Temp Source Oral     SpO2 100 %     Weight      Height      Head Circumference      Peak Flow      Pain Score  Pain Loc      Pain Edu?      Excl. in [place]?    No data found.   Updated Vital Signs BP 118/79 (BP Location: Right Arm)   Pulse 64   Temp 97.3 F (36.3 C) (Oral)   Resp 12   SpO2 100%   Visual Acuity Right Eye Distance:   Left Eye Distance:   Bilateral Distance:    Right Eye Near:   Left Eye Near:    Bilateral Near:     Physical Exam  Constitutional: She is oriented to person, place, and time. She appears well-developed and well-nourished.  HENT:  Head: Normocephalic and atraumatic.  Eyes: EOM are normal. Pupils are equal, round, and reactive to light.  Pulmonary/Chest: Effort normal.  Abdominal: She exhibits no distension.  Musculoskeletal: She exhibits no deformity.  Gait normal.  Neg log roll bilat hips.  Neg SLR.  bilat knees good ROM, with some PF crepitus.  Left knee + patellar grind and apprehension.  Neg mcmurray.  Cruciate and collateral ligaments stable.  Minimal swelling with out effusion.  No  signs of infection. Tender medial plica.  bilat calves nontender,   Neurological: She is oriented to person, place, and time.  Skin: Skin is warm and dry.  Psychiatric: She has a normal mood and affect.     UC Treatments / Results  Labs (all labs ordered are listed, but only abnormal results are displayed) Labs Reviewed - No data to display  EKG  EKG Interpretation None       Radiology Dg Knee Ap/lat W/sunrise Left  Result Date: 06/19/2016 CLINICAL DATA:  Pain over LEFT knee EXAM: LEFT KNEE 3 VIEWS COMPARISON:  None. FINDINGS: No fracture of the proximal tibia or distal femur. Patella is normal. No joint effusion. IMPRESSION: No fracture or dislocation. Electronically Signed   By: Suzy Bouchard M.D.   On: 06/19/2016 14:05   I reviewed xray.  Patient does have some patellofemoral degenerative changes.  Small spur superior patella.   Procedures Procedures (including critical care time)  Medications Ordered in UC Medications - No data to display   Initial Impression / Assessment and Plan / UC Course  I have reviewed the triage vital signs and the nursing notes.  Pertinent labs & imaging results that were available during my care of the patient were reviewed by me and considered in my medical decision making (see chart for details).  Clinical Course      Final Clinical Impressions(s) / UC Diagnoses   Final diagnoses:  Chondromalacia of left patellofemoral joint  Plica of knee, left    New Prescriptions New Prescriptions   DICLOFENAC (VOLTAREN) 75 MG EC TABLET    Take 1 tablet (75 mg total) by mouth 2 (two) times daily as needed (must take with food.).  xrays reviewed with patient.  Will discontinue any other anti-inflammatory med while taking voltaren.  F/u with [facility] Dr Anderson [place] in 1 week for recheck.  Will likely need to restart formal PT.  Discussed possible left knee intra-articular marc/depo injection if she continues to be symptomatic.   Avoid squatting, running, stairs if possible.  All questions answered.     Lanae Crumbly, PA-C 06/19/16 1427

## 2016-06-19 NOTE — ED Triage Notes (Signed)
Assessment per PA 

## 2016-08-10 ENCOUNTER — Ambulatory Visit (INDEPENDENT_AMBULATORY_CARE_PROVIDER_SITE_OTHER): Payer: BLUE CROSS/BLUE SHIELD | Admitting: Medical

## 2016-08-10 ENCOUNTER — Encounter: Payer: Self-pay | Admitting: Medical

## 2016-08-10 VITALS — BP 104/70 | HR 86 | Temp 98.0°F | Ht 63.0 in | Wt 132.0 lb

## 2016-08-10 DIAGNOSIS — R6883 Chills (without fever): Secondary | ICD-10-CM

## 2016-08-10 DIAGNOSIS — J988 Other specified respiratory disorders: Secondary | ICD-10-CM | POA: Diagnosis not present

## 2016-08-10 DIAGNOSIS — R52 Pain, unspecified: Secondary | ICD-10-CM

## 2016-08-10 DIAGNOSIS — R059 Cough, unspecified: Secondary | ICD-10-CM

## 2016-08-10 DIAGNOSIS — R05 Cough: Secondary | ICD-10-CM | POA: Diagnosis not present

## 2016-08-10 LAB — POC INFLUENZA A&B (BINAX/QUICKVUE)
INFLUENZA A, POC: NEGATIVE
Influenza B, POC: NEGATIVE

## 2016-08-10 MED ORDER — CLARITHROMYCIN 500 MG PO TABS: 500.0000 mg | ORAL_TABLET | Freq: Two times a day (BID) | ORAL | 0 refills | Status: DC

## 2016-08-10 MED ORDER — PROMETHAZINE-DM 6.25-15 MG/5ML PO SYRP: 5.0000 mL | ORAL_SOLUTION | Freq: Four times a day (QID) | ORAL | 0 refills | Status: DC | PRN

## 2016-08-10 NOTE — Progress Notes (Signed)
Subjective: Chief Complaint  Patient presents with  . Cough    x2 days, phlegm-dark brown/black, weakness    Here for illness  [employer]  Patient presents today for illness.  She says I have "flu", feel it all over, achy, sputum.  No fever, no ear pain, no sore throat.  No SOB.  No NVD.  Using ibuprofen for symptoms.  Last night had sweats.   No hx/o TB.  2015 when she came to this country had negative TB screen reportedly.  Missed work yesterday and today.   Got written up for missing work.  No sick contacts.  No other aggravating or relieving factors. No other complaint.   Past Medical History:  Diagnosis Date  . Anemia    ROS as in subjective  Objective: BP 104/70 (BP Location: Right Arm, Patient Position: Sitting, Cuff Size: Normal)   Pulse 86   Temp 98 F (36.7 C) (Oral)   Ht 5\' 3"  (1.6 m)   Wt 132 lb (59.9 kg)   SpO2 99%   BMI 23.38 kg/m   General appearance: Alert, WD/WN, no distress, ill appearing                             Skin: warm, no rash, no diaphoresis                           Head: no sinus tenderness                            Eyes: conjunctiva normal, corneas clear, PERRLA                            Ears: pearly TMs, external ear canals normal                          Nose: septum midline, turbinates swollen, with erythema and clear discharge             Mouth/throat: MMM, tongue normal, mild pharyngeal erythema                           Neck: supple, no adenopathy, no thyromegaly, non tender                          Heart: RRR, normal S1, S2, no murmurs                         Lungs: +bronchial breath sounds, +scattered rhonchi, no wheezes, no rales                Extremities: no edema, non tender   Assessment: Encounter Diagnoses  Name Primary?  Marland Kitchen Respiratory tract infection Yes  . Chills   . Cough   . Body aches     Plan: Flu negative swab.  Begin medications below, rest, hydrate well, gave note for work. If not  much improved by end of the week, call or return.    Vivyana was seen today for cough.  Diagnoses and all orders for this visit:  Respiratory tract infection  Chills -     POC Influenza A&B(BINAX/QUICKVUE)  Cough  Body aches -     POC  Influenza A&B(BINAX/QUICKVUE)  Other orders -     clarithromycin (BIAXIN) 500 MG tablet; Take 1 tablet (500 mg total) by mouth 2 (two) times daily. -     promethazine-dextromethorphan (PROMETHAZINE-DM) 6.25-15 MG/5ML syrup; Take 5 mLs by mouth 4 (four) times daily as needed for cough.

## 2016-08-13 ENCOUNTER — Telehealth: Payer: Self-pay | Admitting: Medical

## 2016-08-13 NOTE — Telephone Encounter (Addendum)
Pt requesting an out of work note for [employer] Fri (9/28 - 9-29) because she is still sick and not feeling well enough to return to work. Pt wants note faxed to her job at [employer] at [phone]

## 2016-08-13 NOTE — Telephone Encounter (Signed)
We already gave note for through Thursday.  Extend to Friday.   F/u next week if not completley back to normal

## 2016-08-13 NOTE — Telephone Encounter (Signed)
done

## 2017-01-29 ENCOUNTER — Ambulatory Visit (HOSPITAL_COMMUNITY)
Admission: EM | Admit: 2017-01-29 | Discharge: 2017-01-29 | Disposition: A | Discharge status: Home / Self Care | Payer: PRIVATE HEALTH INSURANCE | Attending: Family Medicine | Admitting: Family Medicine

## 2017-01-29 ENCOUNTER — Encounter (HOSPITAL_COMMUNITY): Payer: Self-pay | Admitting: *Deleted

## 2017-01-29 DIAGNOSIS — M79644 Pain in right finger(s): Secondary | ICD-10-CM

## 2017-01-29 DIAGNOSIS — M779 Enthesopathy, unspecified: Principal | ICD-10-CM

## 2017-01-29 DIAGNOSIS — M778 Other enthesopathies, not elsewhere classified: Secondary | ICD-10-CM

## 2017-01-29 MED ORDER — PREDNISONE 20 MG PO TABS: ORAL_TABLET | ORAL | 0 refills | Status: DC

## 2017-01-29 NOTE — ED Provider Notes (Signed)
[place]    CSN: [id number] Arrival date & time: 01/29/17  1342     History   Chief Complaint Chief Complaint  Patient presents with  . Finger Injury    HPI Jacqueline Reyes is a 36 y.o. female.   Patient reports 3 day history of swelling and pain to distal joint of right thumb, denies injury. He notes that she has a snapping sound every time she flexes the distal phalanx. 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      Past Medical History:  Diagnosis Date  . Anemia     There are no active problems to display for this patient.   History reviewed. No pertinent surgical history.  OB History    No data available       Home Medications    Prior to Admission medications   Medication Sig Start Date End Date Taking? Authorizing Provider  ibuprofen (ADVIL,MOTRIN) 200 MG tablet Take 200 mg by mouth every 6 (six) hours as needed.    Historical Provider, MD  predniSONE (DELTASONE) 20 MG tablet Two daily with food 01/29/17   Robyn Haber, MD    Family History Family History  Problem Relation Age of Onset  . Cancer Neg Hx   . Diabetes Neg Hx   . Heart disease Neg Hx   . Hyperlipidemia Neg Hx   . Stroke Neg Hx     Social History Social History  Substance Use Topics  . Smoking status: Never Smoker  . Smokeless tobacco: Never Used  . Alcohol use No     Allergies   Patient has no known allergies.   Review of Systems Review of Systems  Constitutional: Negative.   Musculoskeletal: Positive for joint swelling.     Physical Exam Triage Vital Signs ED Triage Vitals  Enc Vitals Group     BP 01/29/17 1450 (!) 141/83     Pulse Rate 01/29/17 1450 81     Resp 01/29/17 1450 14     Temp 01/29/17 1450 98 F (36.7 C)     Temp Source 01/29/17 1450 Oral     SpO2 01/29/17 1450 99 %     Weight --      Height --      Head Circumference --      Peak Flow --      Pain Score 01/29/17 1451 5     Pain Loc --      Pain Edu? --      Excl. in [place]?  --    No data found.   Updated Vital Signs BP (!) 141/83 (BP Location: Left Arm) Comment: rn notified  Pulse 81   Temp 98 F (36.7 C) (Oral)   Resp 14   SpO2 99%    Physical Exam  Constitutional: She is oriented to person, place, and time. She appears well-developed and well-nourished.  HENT:  Right Ear: External ear normal.  Left Ear: External ear normal.  Mouth/Throat: Oropharynx is clear and moist.  Eyes: Conjunctivae and EOM are normal. Pupils are equal, round, and reactive to light.  Neck: Normal range of motion. Neck supple.  Pulmonary/Chest: Effort normal.  Musculoskeletal: She exhibits tenderness. She exhibits no deformity.  Patient is right-handed Patient has audible snap when she flexes the distal phalanx of her right thumb. There is mild swelling at the distal dorsal phalanx of the right thumb  Neurological: She is alert and oriented to person, place, and time.  Skin: Skin is warm and dry.  Nursing note and vitals reviewed.    UC Treatments / Results  Labs (all labs ordered are listed, but only abnormal results are displayed) Labs Reviewed - No data to display  EKG  EKG Interpretation None       Radiology No results found.  Procedures Procedures (including critical care time)  Medications Ordered in UC Medications - No data to display   Initial Impression / Assessment and Plan / UC Course  I have reviewed the triage vital signs and the nursing notes.  Pertinent labs & imaging results that were available during my care of the patient were reviewed by me and considered in my medical decision making (see chart for details).     Final Clinical Impressions(s) / UC Diagnoses   Final diagnoses:  Tendonitis of finger    New Prescriptions New Prescriptions   PREDNISONE (DELTASONE) 20 MG TABLET    Two daily with food     Robyn Haber, MD 01/29/17 1502

## 2017-01-29 NOTE — ED Triage Notes (Signed)
Patient reports 3 day history of swelling and pain to distal joint of right thumb, denies injury.

## 2017-02-06 ENCOUNTER — Ambulatory Visit (INDEPENDENT_AMBULATORY_CARE_PROVIDER_SITE_OTHER): Payer: PRIVATE HEALTH INSURANCE

## 2017-02-06 ENCOUNTER — Ambulatory Visit (HOSPITAL_COMMUNITY)
Admission: EM | Admit: 2017-02-06 | Discharge: 2017-02-06 | Disposition: A | Discharge status: Home / Self Care | Payer: PRIVATE HEALTH INSURANCE | Attending: Family Medicine | Admitting: Family Medicine

## 2017-02-06 ENCOUNTER — Encounter (HOSPITAL_COMMUNITY): Payer: Self-pay | Admitting: *Deleted

## 2017-02-06 DIAGNOSIS — K5900 Constipation, unspecified: Secondary | ICD-10-CM

## 2017-02-06 DIAGNOSIS — M779 Enthesopathy, unspecified: Secondary | ICD-10-CM

## 2017-02-06 DIAGNOSIS — R1033 Periumbilical pain: Secondary | ICD-10-CM

## 2017-02-06 DIAGNOSIS — Z3202 Encounter for pregnancy test, result negative: Secondary | ICD-10-CM | POA: Diagnosis not present

## 2017-02-06 DIAGNOSIS — R1030 Lower abdominal pain, unspecified: Secondary | ICD-10-CM

## 2017-02-06 LAB — POCT PREGNANCY, URINE: PREG TEST UR: NEGATIVE

## 2017-02-06 LAB — POCT URINALYSIS DIP (DEVICE)
Bilirubin Urine: NEGATIVE
Glucose, UA: NEGATIVE mg/dL
KETONES UR: NEGATIVE mg/dL
Leukocytes, UA: NEGATIVE
NITRITE: NEGATIVE
PH: 6 (ref 5.0–8.0)
PROTEIN: NEGATIVE mg/dL
Specific Gravity, Urine: <=1.005 (ref 1.005–1.030)
UROBILINOGEN UA: 0.2 mg/dL (ref 0.0–1.0)

## 2017-02-06 MED ORDER — MELOXICAM 7.5 MG PO TABS: 7.5000 mg | ORAL_TABLET | Freq: Every day | ORAL | 0 refills | Status: DC

## 2017-02-06 MED ORDER — POLYETHYLENE GLYCOL 3350 17 GM/SCOOP PO POWD: 1.0000 | Freq: Two times a day (BID) | ORAL | 0 refills | Status: AC

## 2017-02-06 NOTE — ED Provider Notes (Signed)
[place]    CSN: [id number] Arrival date & time: 02/06/17  1729     History   Chief Complaint Chief Complaint  Patient presents with  . Abdominal Pain    HPI Jacqueline Reyes is a 36 y.o. female.   C/O starting with right abd pain yesterday.  Today c/o low-mid abd pain.  Denies fevers, n/v, diarrhea. She also has no dysuria. Last menstrual period was February 25  Patient was just here several days ago for a tendonitis problem in her right thumb. The pain has not gone away though the crepitus has diminished      Past Medical History:  Diagnosis Date  . Anemia     There are no active problems to display for this patient.   History reviewed. No pertinent surgical history.  OB History    No data available       Home Medications    Prior to Admission medications   Medication Sig Start Date End Date Taking? Authorizing Provider  meloxicam (MOBIC) 7.5 MG tablet Take 1 tablet (7.5 mg total) by mouth daily. 02/06/17   Robyn Haber, MD  polyethylene glycol powder (MIRALAX) powder Take 255 g by mouth 2 (two) times daily. 02/06/17   Robyn Haber, MD    Family History Family History  Problem Relation Age of Onset  . Cancer Neg Hx   . Diabetes Neg Hx   . Heart disease Neg Hx   . Hyperlipidemia Neg Hx   . Stroke Neg Hx     Social History Social History  Substance Use Topics  . Smoking status: Never Smoker  . Smokeless tobacco: Never Used  . Alcohol use No     Allergies   Patient has no known allergies.   Review of Systems Review of Systems  Constitutional: Negative.   HENT: Negative.   Gastrointestinal: Positive for abdominal pain.  Genitourinary: Negative.   Musculoskeletal: Positive for arthralgias and joint swelling.  Neurological: Negative.      Physical Exam Triage Vital Signs ED Triage Vitals  Enc Vitals Group     BP 02/06/17 1812 133/82     Pulse Rate 02/06/17 1812 75     Resp 02/06/17 1812 16     Temp 02/06/17 1812  97.6 F (36.4 C)     Temp Source 02/06/17 1812 Oral     SpO2 02/06/17 1812 100 %     Weight --      Height --      Head Circumference --      Peak Flow --      Pain Score 02/06/17 1814 5     Pain Loc --      Pain Edu? --      Excl. in [place]? --    No data found.   Updated Vital Signs BP 133/82   Pulse 75   Temp 97.6 F (36.4 C) (Oral)   Resp 16   LMP 01/12/2017 (Approximate)   SpO2 100%    Physical Exam  Constitutional: She is oriented to person, place, and time. She appears well-developed and well-nourished.  HENT:  Head: Normocephalic.  Right Ear: External ear normal.  Left Ear: External ear normal.  Eyes: Conjunctivae are normal. Pupils are equal, round, and reactive to light.  Neck: Normal range of motion. Neck supple.  Pulmonary/Chest: Effort normal.  Abdominal: Soft. There is tenderness.  Mild midabdominal tenderness with no guarding or rebound.  Musculoskeletal: Normal range of motion. She exhibits tenderness.  Mild tenderness in  the distal interphalangeal joint of the right thumb without deformity  Neurological: She is alert and oriented to person, place, and time.  Skin: Skin is warm and dry.  Psychiatric: She has a normal mood and affect.  Nursing note and vitals reviewed.    UC Treatments / Results  Labs (all labs ordered are listed, but only abnormal results are displayed) Labs Reviewed  POCT URINALYSIS DIP (DEVICE) - Abnormal; Notable for the following:       Result Value   Hgb urine dipstick SMALL (*)    All other components within normal limits  POCT PREGNANCY, URINE    EKG  EKG Interpretation None       Radiology No results found.  Procedures Procedures (including critical care time)  Medications Ordered in UC Medications - No data to display   Initial Impression / Assessment and Plan / UC Course  I have reviewed the triage vital signs and the nursing notes.  Pertinent labs & imaging results that were available during my care  of the patient were reviewed by me and considered in my medical decision making (see chart for details).     Final Clinical Impressions(s) / UC Diagnoses   Final diagnoses:  Periumbilical abdominal pain  Tendonitis  Constipation, unspecified constipation type    New Prescriptions New Prescriptions   MELOXICAM (MOBIC) 7.5 MG TABLET    Take 1 tablet (7.5 mg total) by mouth daily.   POLYETHYLENE GLYCOL POWDER (MIRALAX) POWDER    Take 255 g by mouth 2 (two) times daily.     Robyn Haber, MD 02/06/17 [phone]

## 2017-02-06 NOTE — Discharge Instructions (Signed)
Reason you have abdominal pain is that there is some undigested food that is partially blocking the bowels. We have prescribed some laxative to get that going.  I've also prescribed a medicine to help your thumb. I really think you should call the orthopedic person listed below to have that reevaluated.

## 2017-02-06 NOTE — ED Triage Notes (Signed)
C/O starting with right abd pain yesterday.  Today c/o low-mid abd pain.  Denies fevers, n/v, diarrhea.

## 2017-03-28 IMAGING — CR DG KNEE COMPLETE 4+V*R*
3 series · 3 of 3 positions shown · non-contrast
Comparison: None.

CLINICAL DATA: Chronic anterior right knee pain; history of heavy
lifting in the past

EXAM:
RIGHT KNEE - COMPLETE 4+ VIEW

[w knee ap right]
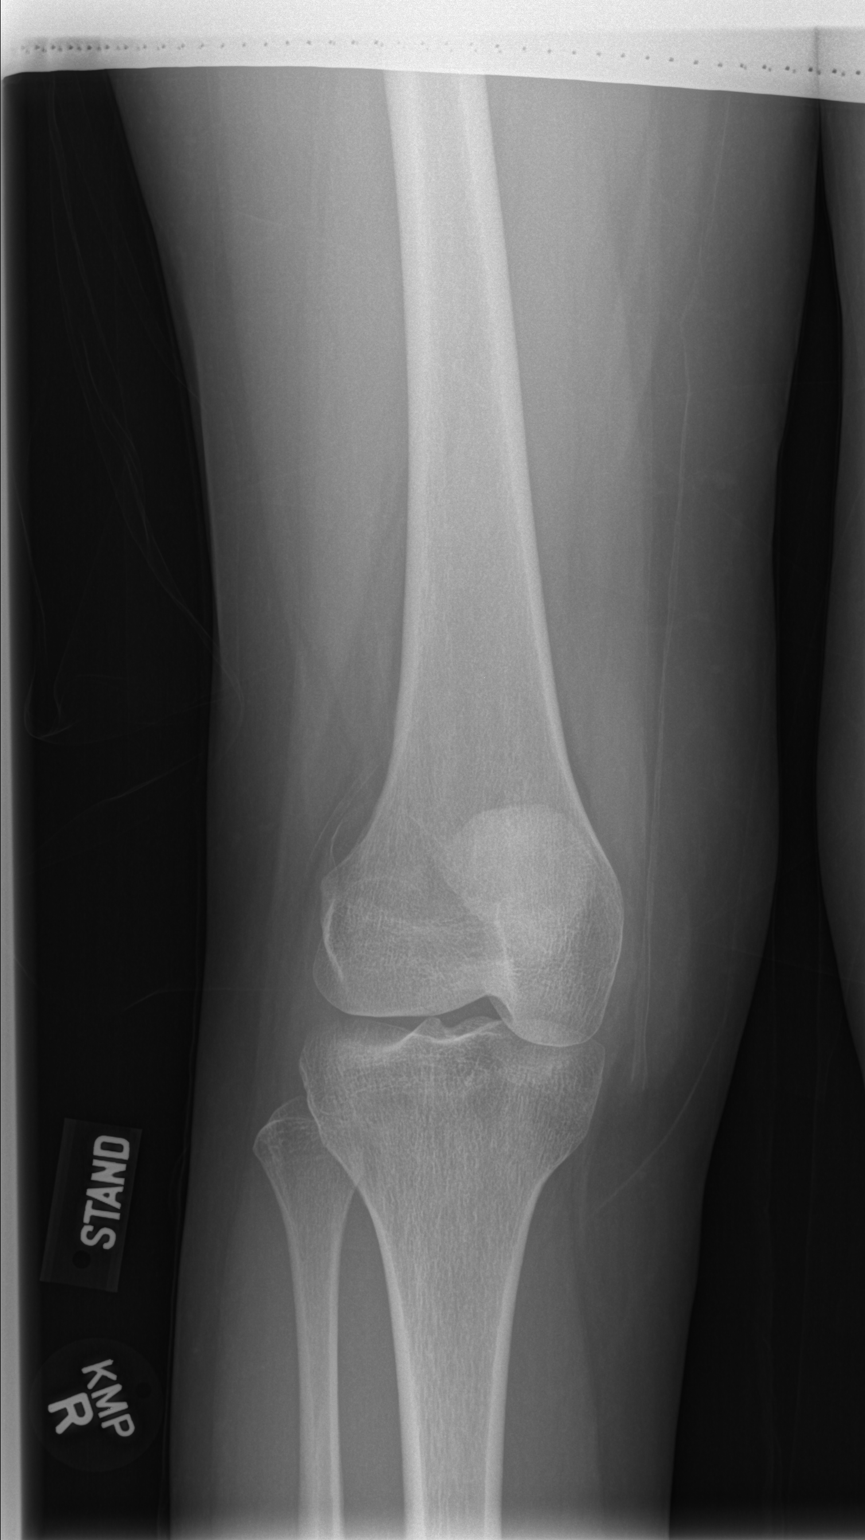

[w knee obl. right]
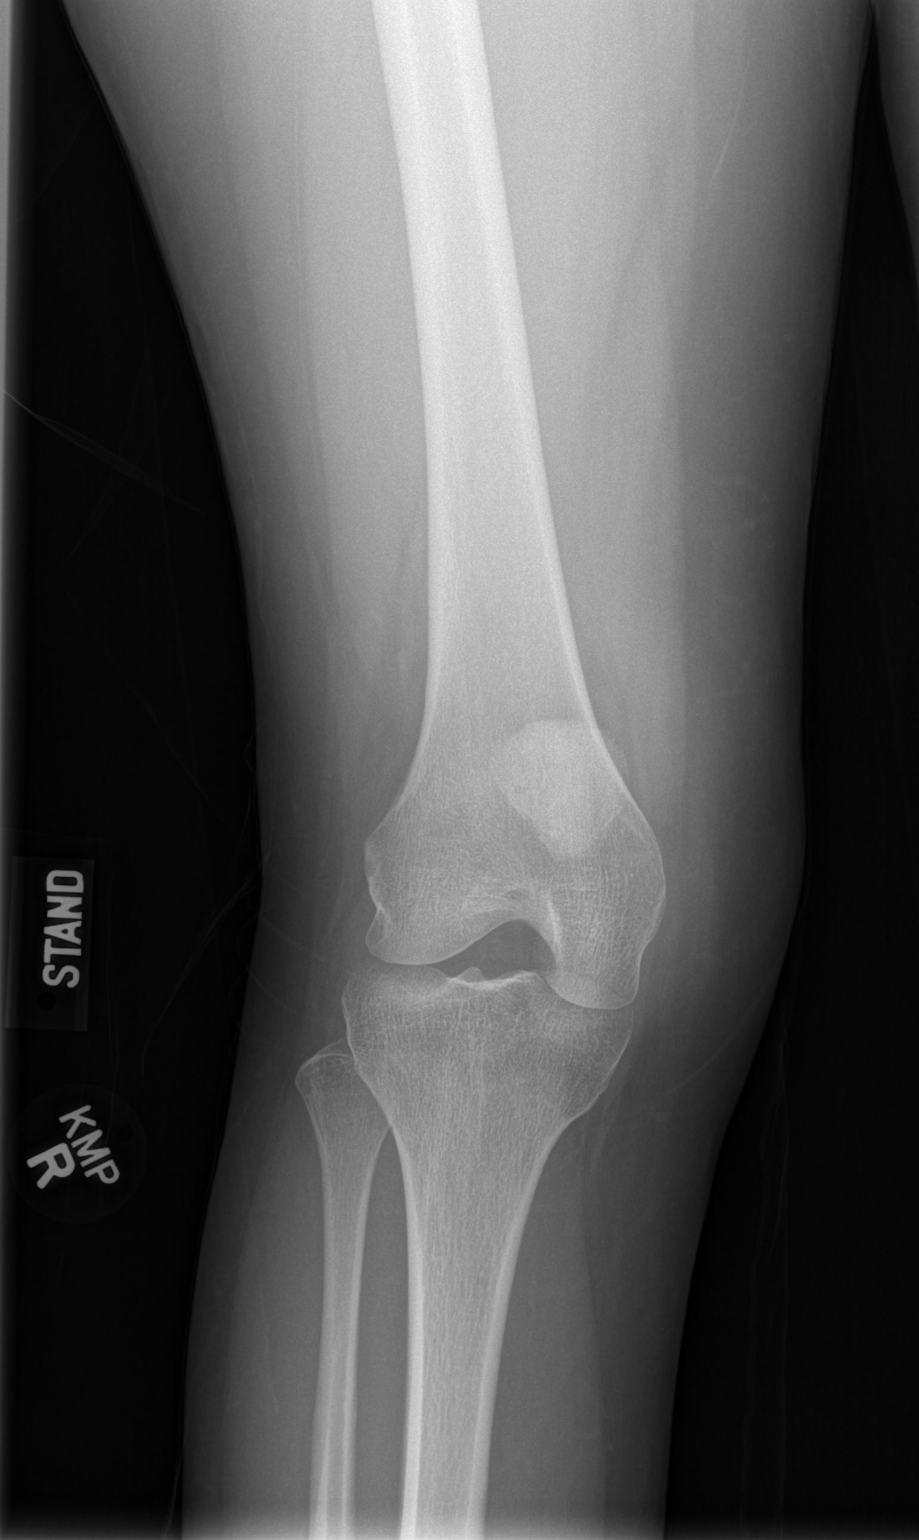

[w knee lat. right]
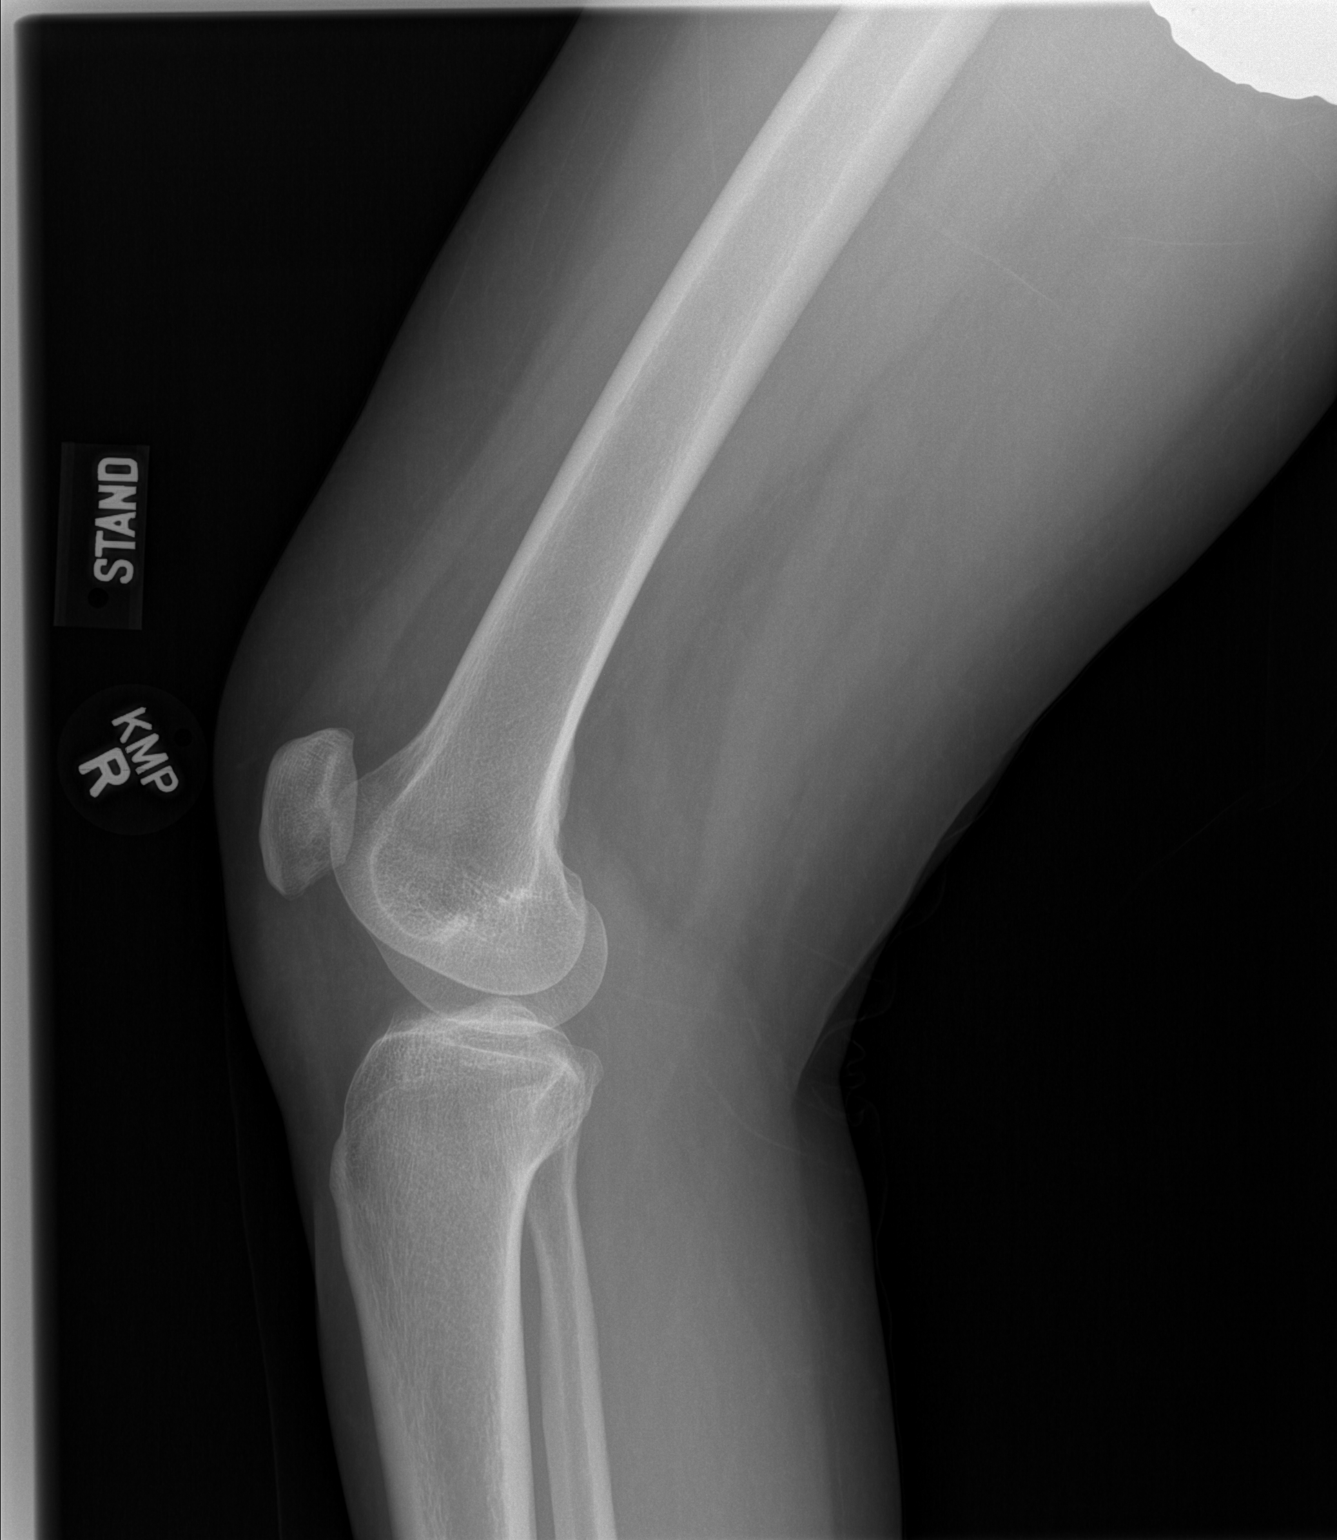

[3 of 3 positions shown; findings below may reference images not displayed]

FINDINGS: The bones of the right knee are adequately mineralized. There is no
acute fracture. There is no significant degenerative change. There
is no joint effusion. The joint spaces are preserved.
IMPRESSION: There is no acute bony abnormality nor significant but degenerative
bony change. Given the patient's symptoms, MRI would be useful to
evaluate the soft tissue structures of the knee.

## 2017-06-23 ENCOUNTER — Ambulatory Visit (INDEPENDENT_AMBULATORY_CARE_PROVIDER_SITE_OTHER): Payer: Self-pay

## 2017-06-23 ENCOUNTER — Encounter (HOSPITAL_COMMUNITY): Payer: Self-pay | Admitting: Emergency Medicine

## 2017-06-23 ENCOUNTER — Ambulatory Visit (HOSPITAL_COMMUNITY)
Admission: EM | Admit: 2017-06-23 | Discharge: 2017-06-23 | Disposition: A | Discharge status: Home / Self Care | Payer: Self-pay | Attending: Internal Medicine | Admitting: Internal Medicine

## 2017-06-23 DIAGNOSIS — M79671 Pain in right foot: Secondary | ICD-10-CM

## 2017-06-23 MED ORDER — HYDROCODONE-ACETAMINOPHEN 5-325 MG PO TABS
ORAL_TABLET | ORAL | Status: AC
  Filled 2017-06-23: qty 1

## 2017-06-23 MED ORDER — HYDROCODONE-ACETAMINOPHEN 5-325 MG PO TABS
1.0000 | ORAL_TABLET | Freq: Once | ORAL | Status: AC
  Administered 2017-06-23: 1 via ORAL

## 2017-06-23 MED ORDER — IBUPROFEN 800 MG PO TABS
ORAL_TABLET | ORAL | Status: AC
  Filled 2017-06-23: qty 1

## 2017-06-23 MED ORDER — IBUPROFEN 800 MG PO TABS
800.0000 mg | ORAL_TABLET | Freq: Once | ORAL | Status: AC
  Administered 2017-06-23: 800 mg via ORAL

## 2017-06-23 NOTE — ED Provider Notes (Signed)
[place]    CSN: [id number] Arrival date & time: 06/23/17  1625     History   Chief Complaint Chief Complaint  Patient presents with  . Foot Pain    HPI Jacqueline Reyes is a 36 y.o. female.   36 year old female states she was just walking a few minutes prior to arriving to the urgent care when she experienced sudden acute severe pain to the plantar aspect of the right foot/heel. The pain is severe without weightbearing but increases substantially with weightbearing and deep palpation of the heel. Denies any known injury. She did not twist her ankle or fall or step on any type of object that would have caused such pain. She is in obvious pain, demonstrating psychomotor agitation of the involved extremity.      Past Medical History:  Diagnosis Date  . Anemia     There are no active problems to display for this patient.   History reviewed. No pertinent surgical history.  OB History    No data available       Home Medications    Prior to Admission medications   Medication Sig Start Date End Date Taking? Authorizing Provider  meloxicam (MOBIC) 7.5 MG tablet Take 1 tablet (7.5 mg total) by mouth daily. 02/06/17   Robyn Haber, MD  polyethylene glycol powder (MIRALAX) powder Take 255 g by mouth 2 (two) times daily. 02/06/17   Robyn Haber, MD    Family History Family History  Problem Relation Age of Onset  . Cancer Neg Hx   . Diabetes Neg Hx   . Heart disease Neg Hx   . Hyperlipidemia Neg Hx   . Stroke Neg Hx     Social History Social History  Substance Use Topics  . Smoking status: Never Smoker  . Smokeless tobacco: Never Used  . Alcohol use No     Allergies   Patient has no known allergies.   Review of Systems Review of Systems  Constitutional: Negative.   Respiratory: Negative.   Gastrointestinal: Negative.   Musculoskeletal:       As per history of present illness. She states that sometimes movement with standing and  ambulation causes pain to shoot up the back of the leg toward the calf.  Skin: Negative for color change, rash and wound.  Neurological: Negative.   All other systems reviewed and are negative.    Physical Exam Triage Vital Signs ED Triage Vitals  Enc Vitals Group     BP 06/23/17 1656 (!) 109/57     Pulse Rate 06/23/17 1656 89     Resp 06/23/17 1656 18     Temp 06/23/17 1656 98.6 F (37 C)     Temp Source 06/23/17 1656 Oral     SpO2 06/23/17 1656 100 %     Weight --      Height --      Head Circumference --      Peak Flow --      Pain Score 06/23/17 1655 10     Pain Loc --      Pain Edu? --      Excl. in [place]? --    No data found.   Updated Vital Signs BP (!) 109/57 (BP Location: Right Arm)   Pulse 89   Temp 98.6 F (37 C) (Oral)   Resp 18   LMP 06/23/2017   SpO2 100%   Visual Acuity Right Eye Distance:   Left Eye Distance:   Bilateral Distance:  Right Eye Near:   Left Eye Near:    Bilateral Near:     Physical Exam  Constitutional: She is oriented to person, place, and time. She appears well-developed and well-nourished. No distress.  HENT:  Head: Normocephalic and atraumatic.  Eyes: EOM are normal.  Neck: Normal range of motion. Neck supple.  Musculoskeletal: She exhibits tenderness. She exhibits no edema or deformity.  Patient points to the mid plantar heel of the right foot as the source of pain. There is no overlying skin changes or swelling. Palpation produces increase pain and tenderness. With no palpation or manipulation the pain in the heel is moderate to severe. Nothing seems to make it better. Weightbearing makes it much worse. Palpation of the Achilles tendon reveals that it is intact. No indentations. Plantarflexion of the foot produces no pain. Dorsiflexion produces no pain. No tenderness over the Achilles tendon or calf. Distal neurovascular motor sensory is grossly intact. Pedal pulse 2+. No tenderness to the dorsum of the foot no swelling to the  dorsum of the foot or ankle.  Neurological: She is alert and oriented to person, place, and time. No cranial nerve deficit.  Skin: Skin is warm and dry.  Nursing note and vitals reviewed.    UC Treatments / Results  Labs (all labs ordered are listed, but only abnormal results are displayed) Labs Reviewed - No data to display  EKG  EKG Interpretation None       Radiology No results found.  Procedures Procedures (including critical care time)  Medications Ordered in UC Medications  ibuprofen (ADVIL,MOTRIN) tablet 800 mg (not administered)  HYDROcodone-acetaminophen (NORCO/VICODIN) 5-325 MG per tablet 1 tablet (not administered)     Initial Impression / Assessment and Plan / UC Course  I have reviewed the triage vital signs and the nursing notes.  Pertinent labs & imaging results that were available during my care of the patient were reviewed by me and considered in my medical decision making (see chart for details).   Jones wrap, crutches  Try applying ice again tomorrow. If it aches at her worst then stop. Use the crutches and no weightbearing for the next 2-3 days. Then gradual weightbearing as tolerated. If your heel was not getting better in 3-4 days follow-up with the podiatrist. Take ibuprofen 600 mg every 6 hours as needed for pain.  Final Clinical Impressions(s) / UC Diagnoses   Final diagnoses:  None    New Prescriptions New Prescriptions   No medications on file     Controlled Substance Prescriptions [place] Controlled Substance Registry consulted? Not Applicable   Janne Napoleon, NP 06/23/17 1810

## 2017-06-23 NOTE — ED Triage Notes (Signed)
Sudden onset of foot pain today while walking, denies an injury.  Pain in center of sole of foot towards the heel.

## 2017-06-23 NOTE — Discharge Instructions (Signed)
Try applying ice again tomorrow. If it aches at her worst then stop. Use the crutches and no weightbearing for the next 2-3 days. Then gradual weightbearing as tolerated. If your heel was not getting better in 3-4 days follow-up with the podiatrist. Take ibuprofen 600 mg every 6 hours as needed for pain.

## 2017-06-27 ENCOUNTER — Encounter: Payer: Self-pay | Admitting: Podiatry

## 2017-06-27 ENCOUNTER — Ambulatory Visit: Payer: Self-pay

## 2017-06-27 ENCOUNTER — Ambulatory Visit (INDEPENDENT_AMBULATORY_CARE_PROVIDER_SITE_OTHER): Payer: Self-pay | Admitting: Podiatry

## 2017-06-27 VITALS — BP 127/81 | HR 78 | Resp 16 | Ht 60.0 in | Wt 123.0 lb

## 2017-06-27 DIAGNOSIS — S99921A Unspecified injury of right foot, initial encounter: Secondary | ICD-10-CM

## 2017-06-27 DIAGNOSIS — M722 Plantar fascial fibromatosis: Secondary | ICD-10-CM

## 2017-06-27 MED ORDER — TRIAMCINOLONE ACETONIDE 10 MG/ML IJ SUSP
10.0000 mg | Freq: Once | INTRAMUSCULAR | Status: AC
  Administered 2017-06-27: 10 mg

## 2017-06-27 NOTE — Patient Instructions (Signed)

## 2017-06-27 NOTE — Progress Notes (Signed)
   Subjective:    Patient ID: Jacqueline Reyes, female    DOB: 1981/05/14, 36 y.o.   MRN: [id number]  HPI Chief Complaint  Patient presents with  . Foot Pain    Right foot; bottom of heel; pt stated, "Hurts more in the morning; has had since Thursday, June 23, 2017"      Review of Systems  All other systems reviewed and are negative.      Objective:   Physical Exam        Assessment & Plan:

## 2017-06-28 ENCOUNTER — Encounter: Payer: Self-pay | Admitting: Podiatry

## 2017-06-29 ENCOUNTER — Ambulatory Visit (INDEPENDENT_AMBULATORY_CARE_PROVIDER_SITE_OTHER): Payer: Self-pay | Admitting: Podiatry

## 2017-06-29 ENCOUNTER — Encounter: Payer: Self-pay | Admitting: Podiatry

## 2017-06-29 DIAGNOSIS — M722 Plantar fascial fibromatosis: Secondary | ICD-10-CM

## 2017-06-29 MED ORDER — DICLOFENAC SODIUM 75 MG PO TBEC: 75.0000 mg | DELAYED_RELEASE_TABLET | Freq: Two times a day (BID) | ORAL | 2 refills | Status: DC

## 2017-06-29 NOTE — Progress Notes (Signed)
Subjective:    Patient ID: Jacqueline Reyes, female   DOB: 36 y.o.   MRN: [id number]   HPI patient presents stating she has a lot of pain in the plantar of her right heel    ROS      Objective:  Physical Exam  Cardiovascular: Intact distal pulses.   Pulmonary/Chest: Effort normal.  Neurological: She is alert.  Skin: Skin is warm.  Nursing note and vitals reviewed.  neurovascular status intact muscle strength was found to be 8 within normal limits with range of motion within normal limits. Patient's found have pain in the plantar right heel     Assessment:    Acute plantar fasciitis right     Plan:   Injected the right plantar fascia 3 mg Kenalog 5 mill grams Xylocaine and instructed on physical therapy supportive shoes placed on diclofenac and reappoint as needed

## 2017-06-30 NOTE — Progress Notes (Signed)
Subjective:    Patient ID: Jacqueline Reyes, female   DOB: 36 y.o.   MRN: [id number]   HPI patient states she's having trouble working and still having discomfort in the plantar of her heel    ROS      Objective:  Physical Exam neurovascular status intact with patient's right heel been continued sore not as significant with discomfort also on the outside     Assessment:    Plantar fasciitis present but improved     Plan:    Advised on anti-inflammatories and reappoint to recheck and placed in a fascial brace

## 2018-01-03 ENCOUNTER — Ambulatory Visit (HOSPITAL_COMMUNITY)
Admission: EM | Admit: 2018-01-03 | Discharge: 2018-01-03 | Disposition: A | Discharge status: Home / Self Care | Payer: Managed Care, Other (non HMO) | Attending: Internal Medicine | Admitting: Internal Medicine

## 2018-01-03 ENCOUNTER — Other Ambulatory Visit: Payer: Self-pay

## 2018-01-03 ENCOUNTER — Encounter (HOSPITAL_COMMUNITY): Payer: Self-pay | Admitting: Emergency Medicine

## 2018-01-03 DIAGNOSIS — M545 Low back pain, unspecified: Secondary | ICD-10-CM

## 2018-01-03 LAB — POCT URINALYSIS DIP (DEVICE)
Bilirubin Urine: NEGATIVE
GLUCOSE, UA: NEGATIVE mg/dL
KETONES UR: NEGATIVE mg/dL
Leukocytes, UA: NEGATIVE
Nitrite: NEGATIVE
PH: 8.5 — AB (ref 5.0–8.0)
PROTEIN: NEGATIVE mg/dL
Specific Gravity, Urine: 1.015 (ref 1.005–1.030)
UROBILINOGEN UA: 0.2 mg/dL (ref 0.0–1.0)

## 2018-01-03 MED ORDER — NAPROXEN 500 MG PO TABS: 500.0000 mg | ORAL_TABLET | Freq: Two times a day (BID) | ORAL | 0 refills | Status: DC

## 2018-01-03 MED ORDER — KETOROLAC TROMETHAMINE 60 MG/2ML IM SOLN: 60.0000 mg | Freq: Once | INTRAMUSCULAR | Status: DC

## 2018-01-03 MED ORDER — KETOROLAC TROMETHAMINE 60 MG/2ML IM SOLN
INTRAMUSCULAR | Status: AC
  Filled 2018-01-03: qty 2

## 2018-01-03 NOTE — ED Provider Notes (Signed)
[facility]    CSN: [id number] Arrival date & time: 01/03/18  1057     History   Chief Complaint Chief Complaint  Patient presents with  . Back Pain    HPI Jacqueline Reyes is a 37 y.o. female.   She presents today with 2 weeks history of back pain, mostly in her low back, across the low back, but yesterday had an episode of right flank pain.  She has been stretching, friend of hers has had to do physical therapy for disc problem and shared the exercises with her, but her back really is not improving.  No leg weakness or clumsiness, no change in bowels or bladder.  Not nauseous or vomiting.  No injury recalled.  Patient was able to walk into the urgent care independently, and climb on to the exam table without difficulty.   HPI  Past Medical History:  Diagnosis Date  . Anemia    History reviewed. No pertinent surgical history.    Home Medications    Prior to Admission medications   Medication Sig Start Date End Date Taking? Authorizing Provider  naproxen (NAPROSYN) 500 MG tablet Take 1 tablet (500 mg total) by mouth 2 (two) times daily. 01/03/18   Wynona Luna, MD  polyethylene glycol powder [facility]) powder Take 255 g by mouth 2 (two) times daily. 02/06/17   Robyn Haber, MD    Family History Family History  Problem Relation Age of Onset  . Cancer Neg Hx   . Diabetes Neg Hx   . Heart disease Neg Hx   . Hyperlipidemia Neg Hx   . Stroke Neg Hx     Social History Social History   Tobacco Use  . Smoking status: Never Smoker  . Smokeless tobacco: Never Used  Substance Use Topics  . Alcohol use: No    Alcohol/week: 0.0 oz  . Drug use: No     Allergies   Patient has no known allergies.   Review of Systems Review of Systems  All other systems reviewed and are negative.    Physical Exam Triage Vital Signs ED Triage Vitals  Enc Vitals Group     BP 01/03/18 1212 119/76     Pulse Rate 01/03/18 1212 80     Resp 01/03/18 1212 18   Temp 01/03/18 1212 98.2 F (36.8 C)     Temp Source 01/03/18 1212 Oral     SpO2 01/03/18 1212 100 %     Weight --      Height --      Pain Score 01/03/18 1210 6     Pain Loc --    Updated Vital Signs BP 119/76 (BP Location: Left Arm)   Pulse 80   Temp 98.2 F (36.8 C) (Oral)   Resp 18   SpO2 100%   Physical Exam  Constitutional: She is oriented to person, place, and time. No distress.  HENT:  Head: Atraumatic.  Eyes:  Conjugate gaze observed, no eye redness/discharge  Neck: Neck supple.  Cardiovascular: Normal rate.  Pulmonary/Chest: No respiratory distress.  Abdominal: She exhibits no distension.  Musculoskeletal: Normal range of motion.  No focal tenderness of the midline posterior spinal column, no paraspinal spasm.  Range of motion of the back is excellent.  Neurological: She is alert and oriented to person, place, and time.  Skin: Skin is warm and dry.  Nursing note and vitals reviewed.    UC Treatments / Results  Labs Results for orders placed or performed during the  hospital encounter of 01/03/18  POCT urinalysis dip (device)  Result Value Ref Range   Glucose, UA NEGATIVE NEGATIVE mg/dL   Bilirubin Urine NEGATIVE NEGATIVE   Ketones, ur NEGATIVE NEGATIVE mg/dL   Specific Gravity, Urine 1.015 1.005 - 1.030   Hgb urine dipstick TRACE (A) NEGATIVE   pH 8.5 (H) 5.0 - 8.0   Protein, ur NEGATIVE NEGATIVE mg/dL   Urobilinogen, UA 0.2 0.0 - 1.0 mg/dL   Nitrite NEGATIVE NEGATIVE   Leukocytes, UA NEGATIVE NEGATIVE    Procedures Procedures (including critical care time) None today  Final Clinical Impressions(s) / UC Diagnoses   Final diagnoses:  Bilateral low back pain without sciatica, unspecified chronicity   Anticipate gradual improvement in low and mid back pain over the next several days.  Prescription for naproxen, anti-inflammatory/pain reliever, was sent to the pharmacy.  Urine test at the urgent care had a trace of blood in it today, could be  menstrual.  If back pain does not resolve consider evaluation for kidney stone.  The urine test was done because of right side/flank pain on 2/18.  Push fluids and rest.  Work note given, return to work on 2/21.  ED Discharge Orders        Ordered    naproxen (NAPROSYN) 500 MG tablet  2 times daily     01/03/18 1310         Wynona Luna, MD 01/05/18 1726

## 2018-01-03 NOTE — ED Triage Notes (Signed)
Back pain for 2 weeks.  Pain has been from top to bottom of back at different time during the past 2 weeks.

## 2018-01-03 NOTE — ED Notes (Signed)
Sent pt to bathroom for clean and dirty urine sample.

## 2018-01-03 NOTE — Discharge Instructions (Addendum)
Anticipate gradual improvement in low and mid back pain over the next several days.  Prescription for naproxen, anti-inflammatory/pain reliever, was sent to the pharmacy.  Urine test at the urgent care had a trace of blood in it today, could be menstrual.  If back pain does not resolve consider evaluation for kidney stone.  The urine test was done because of right side/flank pain on 2/18.  Push fluids and rest.  Work note given, return to work on 2/21.

## 2018-01-24 ENCOUNTER — Ambulatory Visit
Admission: RE | Admit: 2018-01-24 | Discharge: 2018-01-24 | Disposition: A | Discharge status: Home / Self Care | Payer: Managed Care, Other (non HMO) | Source: Ambulatory Visit | Attending: Family Medicine | Admitting: Family Medicine

## 2018-01-24 ENCOUNTER — Ambulatory Visit (INDEPENDENT_AMBULATORY_CARE_PROVIDER_SITE_OTHER): Payer: Managed Care, Other (non HMO) | Admitting: Family Medicine

## 2018-01-24 VITALS — BP 120/70 | HR 75 | Temp 97.7°F | Wt 148.0 lb

## 2018-01-24 DIAGNOSIS — R0781 Pleurodynia: Secondary | ICD-10-CM

## 2018-01-24 DIAGNOSIS — R109 Unspecified abdominal pain: Secondary | ICD-10-CM

## 2018-01-24 LAB — CBC WITH DIFFERENTIAL/PLATELET
BASOS ABS: 0 10*3/uL (ref 0.0–0.2)
BASOS: 1 %
EOS (ABSOLUTE): 0.1 10*3/uL (ref 0.0–0.4)
Eos: 2 %
HEMOGLOBIN: 11.5 g/dL (ref 11.1–15.9)
Hematocrit: 35.6 % (ref 34.0–46.6)
IMMATURE GRANS (ABS): 0 10*3/uL (ref 0.0–0.1)
IMMATURE GRANULOCYTES: 0 %
LYMPHS: 35 %
Lymphocytes Absolute: 2 10*3/uL (ref 0.7–3.1)
MCH: 27.6 pg (ref 26.6–33.0)
MCHC: 32.3 g/dL (ref 31.5–35.7)
MCV: 85 fL (ref 79–97)
MONOCYTES: 11 %
Monocytes Absolute: 0.6 10*3/uL (ref 0.1–0.9)
NEUTROS PCT: 51 %
Neutrophils Absolute: 3 10*3/uL (ref 1.4–7.0)
PLATELETS: 387 10*3/uL — AB (ref 150–379)
RBC: 4.17 x10E6/uL (ref 3.77–5.28)
RDW: 13.2 % (ref 12.3–15.4)
WBC: 5.8 10*3/uL (ref 3.4–10.8)

## 2018-01-24 LAB — COMPREHENSIVE METABOLIC PANEL
ALT: 14 IU/L (ref 0–32)
AST: 15 IU/L (ref 0–40)
Albumin/Globulin Ratio: 1.1 — ABNORMAL LOW (ref 1.2–2.2)
Albumin: 3.9 g/dL (ref 3.5–5.5)
Alkaline Phosphatase: 80 IU/L (ref 39–117)
BUN/Creatinine Ratio: 10 (ref 9–23)
BUN: 5 mg/dL — AB (ref 6–20)
Bilirubin Total: 0.2 mg/dL (ref 0.0–1.2)
CALCIUM: 9.1 mg/dL (ref 8.7–10.2)
CO2: 22 mmol/L (ref 20–29)
CREATININE: 0.49 mg/dL — AB (ref 0.57–1.00)
Chloride: 105 mmol/L (ref 96–106)
GFR, EST AFRICAN AMERICAN: 146 mL/min/{1.73_m2} (ref >59)
GFR, EST NON AFRICAN AMERICAN: 127 mL/min/{1.73_m2} (ref >59)
GLUCOSE: 78 mg/dL (ref 65–99)
Globulin, Total: 3.4 g/dL (ref 1.5–4.5)
Potassium: 4.1 mmol/L (ref 3.5–5.2)
Sodium: 141 mmol/L (ref 134–144)
TOTAL PROTEIN: 7.3 g/dL (ref 6.0–8.5)

## 2018-01-24 NOTE — Progress Notes (Signed)
   Subjective:    Patient ID: Jacqueline Reyes, female    DOB: [birth year], 37 y.o.   MRN: [id number]  HPI She is here for consult concerning her right back and flank discomfort.  She initially described it as pleuritic in nature but she is not having difficulty with shortness of breath, cough, fever, chills.  No relation to eating or movement.  She is from [place] and her language skills does interfere.  It was difficult to really get a good history from her as to exactly what causes her symptoms.  She has been seen in urgent care as well as emergency room.  She later stated that she apparently has had difficulty with this intermittently over the last several months.  Also she presently is on her menses.   Review of Systems     Objective:   Physical Exam Alert and in no distress.  Full motion of her back.  Lungs are clear to auscultation.  Abdominal exam shows no masses or tenderness.  Negative Homans sign.  Bowel sounds are normal.       Assessment & Plan:  Right flank pain - Plan: DG Chest 2 View, DG Abd 1 View, CBC with Differential/Platelet, Comprehensive metabolic panel  Pleuritic pain - Plan: DG Chest 2 View, DG Abd 1 View, CBC with Differential/Platelet, Comprehensive metabolic panel  I explained that at this time is difficult to say what is causing her symptoms.  We will do blood work and x-rays and if no diagnosis, recommend she return here with an interpreter.  I explained that we will help get an interpreter

## 2018-01-27 ENCOUNTER — Encounter: Payer: Self-pay | Admitting: Medical

## 2018-01-27 ENCOUNTER — Ambulatory Visit (INDEPENDENT_AMBULATORY_CARE_PROVIDER_SITE_OTHER): Payer: Managed Care, Other (non HMO) | Admitting: Medical

## 2018-01-27 VITALS — BP 124/80 | HR 84 | Wt 147.6 lb

## 2018-01-27 DIAGNOSIS — M545 Low back pain: Secondary | ICD-10-CM

## 2018-01-27 DIAGNOSIS — R109 Unspecified abdominal pain: Secondary | ICD-10-CM

## 2018-01-27 DIAGNOSIS — R8290 Unspecified abnormal findings in urine: Secondary | ICD-10-CM | POA: Diagnosis not present

## 2018-01-27 DIAGNOSIS — M419 Scoliosis, unspecified: Secondary | ICD-10-CM | POA: Diagnosis not present

## 2018-01-27 DIAGNOSIS — G8929 Other chronic pain: Secondary | ICD-10-CM

## 2018-01-27 LAB — POCT URINALYSIS DIP (PROADVANTAGE DEVICE)
BILIRUBIN UA: NEGATIVE
BILIRUBIN UA: NEGATIVE mg/dL
Glucose, UA: NEGATIVE mg/dL
Leukocytes, UA: NEGATIVE
Nitrite, UA: NEGATIVE
PH UA: 6 (ref 5.0–8.0)
PROTEIN UA: NEGATIVE mg/dL
Specific Gravity, Urine: 1.015
Urobilinogen, Ur: NEGATIVE

## 2018-01-27 MED ORDER — CYCLOBENZAPRINE HCL 5 MG PO TABS: 5.0000 mg | ORAL_TABLET | Freq: Every evening | ORAL | 0 refills | Status: DC | PRN

## 2018-01-27 NOTE — Progress Notes (Signed)
Subjective: Chief Complaint  Patient presents with  . Follow-up    follow up  discuss x-ray and lab work    Here for follow up.   Here with interpreter from [place], Kandee Keen  Here to discuss results from xray and labs.   She saw Dr. Redmond School here 2 days ago for right flank pain.     She has been having pains in right flank, right low and mid back.  Been having pains in same area intermittent for over a year.  Sometimes for days will have the pain.  No abdominal pain, no pelvic pain.   No fever, no chills.     Sometimes exercising, some calisthenics.   Works as a Optician, dispensing at [employer], Warehouse manager.  Typically objects are around 15lbs, throughout the day.   Usually have BM every 2 days, usually BMs are ok, not painful.   Urinary: no dysuria, no blood in urine, no urinary frequency, no urinary urgency.  No other aggravating or relieving factors. No other complaint.   Past Medical History:  Diagnosis Date  . Anemia    Current Outpatient Medications on File Prior to Visit  Medication Sig Dispense Refill  . naproxen (NAPROSYN) 500 MG tablet Take 1 tablet (500 mg total) by mouth 2 (two) times daily. (Patient not taking: Reported on 01/27/2018) 30 tablet 0  . polyethylene glycol powder (MIRALAX) powder Take 255 g by mouth 2 (two) times daily. (Patient not taking: Reported on 01/27/2018) 255 g 0   No current facility-administered medications on file prior to visit.    ROS as in subjective   Objective: BP 124/80   Pulse 84   Wt 147 lb 9.6 oz (67 kg)   SpO2 99%   BMI 28.83 kg/m   Wt Readings from Last 3 Encounters:  01/27/18 147 lb 9.6 oz (67 kg)  01/24/18 148 lb (67.1 kg)  06/27/17 123 lb (55.8 kg)   Gen: wd, wn, nad Skin unremarkable Abdomen+bs, soft, nonnteder, no mass, no organomegaly Back: nonnteder, normal ROM, no deformity, no CVA tenderness Hips nonnteder, normal ROM, no deformity Lungs clear   CXR normal 01/24/18  Xray abdomen 01/24/18:  IMPRESSION: Nonobstructive bowel gas pattern with moderate proximal colonic fecal material  Labs: Reviewed CMET and CBC from 01/24/18  Urinalysis 01/03/18 with 8.5 pH, trace Hgb   Assessment: Encounter Diagnoses  Name Primary?  . Flank pain Yes  . Chronic right-sided low back pain without sciatica   . Scoliosis, unspecified scoliosis type, unspecified spinal region   . Abnormal urinalysis      Plan: UA reviewed today.  She is on her period.  Discussed differential diagnosis for her symptoms.   Flank pain, right low back pain, scoliosis per xray 2016.   I suspect her pain is musculoskeletal.   She works as a Optician, dispensing, Designer, industrial/product for a full shift.    Specific recommendations to help your current back pain include: Rest your back for the next 5-7 days Avoid strenuous activity or heavy lifting for the next 5-7 days You may use heat to your back such as a hot shower, moist hot towel, or heat pad 2-3 times per day short-term Consider going to a massage therapist  You can use OTC Aleve or Ibuprofen for pain relief.  You may use the muscle relaxer Flexeril I prescribed to help reduce spasm and tension in your muscles.  Primarily use this as bedtime as it may cause drowsiness.  If you will be home for  several hours during the day you may use 1/2 tablet during the day.  Do not drive or operate machinery while taking this medication.  General recommendations to help prevent back problems and to keep your back strong and healthy: I recommend you get 150 minutes of aerobic exercise per week such as walking, running, swimming, dancing, golf or other exercise. I recommend you do a general stretching routine daily. To prevent back issues, I recommend you use some core strengthening exercises as we discussed.  For example, do the following regimen at least 2 days per week:  perform 2-3 sets of 25 in each of abdominal crunches or sit ups  perform 2-3 sets of 25 in each of core  twists  perform 2-3 sets of 25 in each of upright rows  perform 2-3 sets of 25 in each of dead lifts Drink plenty of water throughout the day so that your urine is clear If you smoke, I strongly recommend you quit smoking as this causes inflammation throughout the body including your back  Please note: If you experience worsening or significantly different symptoms in the near future then call or return immediately If you develop fever, urinary changes, bowel changes, body aches and chills, then get reevaluated immediately by either calling, returning, or going to the urgent care or emergency department after hours or weekend If you have severe back pain compared to today's visit, or numbness of the genitalia, incontinence of bowel or bladder, or develop the inability to walk or stand, then go to the emergency department immediately   Follow up: 2-3 weeks  Sola was seen today for follow-up.  Diagnoses and all orders for this visit:  Flank pain  Chronic right-sided low back pain without sciatica  Scoliosis, unspecified scoliosis type, unspecified spinal region  Abnormal urinalysis -     POCT Urinalysis DIP (Proadvantage Device)  Other orders -     cyclobenzaprine (FLEXERIL) 5 MG tablet; Take 1 tablet (5 mg total) by mouth at bedtime as needed for muscle spasms.

## 2018-07-03 ENCOUNTER — Encounter: Payer: Self-pay | Admitting: Medical

## 2018-07-03 ENCOUNTER — Ambulatory Visit (INDEPENDENT_AMBULATORY_CARE_PROVIDER_SITE_OTHER): Payer: Managed Care, Other (non HMO) | Admitting: Medical

## 2018-07-03 VITALS — BP 110/70 | HR 84 | Temp 97.8°F | Resp 16 | Ht 63.0 in | Wt 138.0 lb

## 2018-07-03 DIAGNOSIS — Q181 Preauricular sinus and cyst: Secondary | ICD-10-CM

## 2018-07-03 DIAGNOSIS — H60502 Unspecified acute noninfective otitis externa, left ear: Secondary | ICD-10-CM | POA: Diagnosis not present

## 2018-07-03 MED ORDER — HYDROCODONE-ACETAMINOPHEN 5-325 MG PO TABS: 1.0000 | ORAL_TABLET | Freq: Four times a day (QID) | ORAL | 0 refills | Status: DC | PRN

## 2018-07-03 MED ORDER — NEOMYCIN-COLIST-HC-THONZONIUM 3.3-3-10-0.5 MG/ML OT SUSP: 3.0000 [drp] | Freq: Four times a day (QID) | OTIC | 0 refills | Status: DC

## 2018-07-03 MED ORDER — AMOXICILLIN-POT CLAVULANATE 875-125 MG PO TABS: 1.0000 | ORAL_TABLET | Freq: Two times a day (BID) | ORAL | 0 refills | Status: DC

## 2018-07-03 NOTE — Patient Instructions (Signed)
Encounter Diagnoses  Name Primary?  . Acute otitis externa of left ear, unspecified type Yes  . Cyst of ear canal     Your exam today suggest an external ear infection as well as a possible cyst that may be the source of the infection  Begin antibiotic Augmentin 1 tablet twice a day for 10 days  Begin Cortisporin HC eardrops in the left ear for about a week, 3 times a day  You can use over-the-counter pain medicine such as ibuprofen or Tylenol during the daytime.  For worse pain you can use the hydrocodone pain medicine prescribed today  Your symptoms should resolve within the next 10 days  If the knot or nodule that you felt over your left ear today does not resolve within the next month or 2, then let me know

## 2018-07-03 NOTE — Progress Notes (Signed)
Subjective: Chief Complaint  Patient presents with  . ear ache    ear infection X saturday   Here for left ear infection x 3 days.   Had this about 3 years ago after begin in [place].   She reports ear pain on left, ear drainage, feels tender knot in front of left ear.  Has headache.   No fever, no NVD, no sinus pressure, no sore throat.  No other aggravating or relieving factors. No other complaint.   Past Medical History:  Diagnosis Date  . Anemia    Current Outpatient Medications on File Prior to Visit  Medication Sig Dispense Refill  . ibuprofen (ADVIL,MOTRIN) 200 MG tablet Take 200 mg by mouth every 6 (six) hours as needed.    . polyethylene glycol powder (MIRALAX) powder Take 255 g by mouth 2 (two) times daily. (Patient not taking: Reported on 01/27/2018) 255 g 0   No current facility-administered medications on file prior to visit.    ROS as in subjective    Objective: BP 110/70   Pulse 84   Temp 97.8 F (36.6 C) (Oral)   Resp 16   Ht 5\' 3"  (1.6 m)   Wt 138 lb (62.6 kg)   SpO2 98%   BMI 24.45 kg/m   Gen: wd, wn, nad Left ear canal swollen and purulent discharge present but canal is patent, there is a cystic lesion, small in anterior portion of canal, left ear tragus tender with possible cystic feeling area just anterior to tragus, otherwise HENT unremarkable Neck: supple, nontender, no lymphadenopathy    Assessment: Encounter Diagnoses  Name Primary?  . Acute otitis externa of left ear, unspecified type Yes  . Cyst of ear canal      Plan: Discussed findings.  Reviewed the following recommendations with her.    Recommendations: Your exam today suggest an external ear infection as well as a possible cyst that may be the source of the infection  Begin antibiotic Augmentin 1 tablet twice a day for 10 days  Begin Cortisporin HC eardrops in the left ear for about a week, 3 times a day  You can use over-the-counter pain medicine such as ibuprofen or  Tylenol during the daytime.  For worse pain you can use the hydrocodone pain medicine prescribed today  Your symptoms should resolve within the next 10 days  If the knot or nodule that you felt over your left ear today does not resolve within the next month or 2, then let me know   Chelby was seen today for ear ache.  Diagnoses and all orders for this visit:  Acute otitis externa of left ear, unspecified type  Cyst of ear canal  Other orders -     amoxicillin-clavulanate (AUGMENTIN) 875-125 MG tablet; Take 1 tablet by mouth 2 (two) times daily. -     HYDROcodone-acetaminophen (NORCO) 5-325 MG tablet; Take 1 tablet by mouth every 6 (six) hours as needed for moderate pain. -     neomycin-colistin-hydrocortisone-thonzonium (CORTISPORIN-TC) 3.01-15-09-0.5 MG/ML OTIC suspension; Place 3 drops into the left ear 4 (four) times daily.

## 2018-07-05 ENCOUNTER — Telehealth: Payer: Self-pay | Admitting: Medical

## 2018-07-05 ENCOUNTER — Ambulatory Visit (INDEPENDENT_AMBULATORY_CARE_PROVIDER_SITE_OTHER): Payer: Managed Care, Other (non HMO) | Admitting: Medical

## 2018-07-05 ENCOUNTER — Encounter: Payer: Self-pay | Admitting: Medical

## 2018-07-05 VITALS — BP 120/78 | HR 72 | Temp 98.3°F | Resp 16 | Ht 63.0 in | Wt 138.0 lb

## 2018-07-05 DIAGNOSIS — Q181 Preauricular sinus and cyst: Secondary | ICD-10-CM | POA: Diagnosis not present

## 2018-07-05 DIAGNOSIS — H60502 Unspecified acute noninfective otitis externa, left ear: Secondary | ICD-10-CM

## 2018-07-05 NOTE — Telephone Encounter (Signed)
Make sure she is taking the oral antibiotic and the topical.   unfortunately insurance made [place] change to Ciprodex instead of the initial prescription.  If worse, we may need to have her come in as there seemed to be a cyst in her ear.   If this is much worse, I need to look at it.  She may need to see ENT

## 2018-07-05 NOTE — Telephone Encounter (Signed)
Patient is worse and pain is making her cry all night.   Made her an appointment for today at 1215.

## 2018-07-05 NOTE — Patient Instructions (Signed)
Report today to [facility] and Throat at 2:40pm  [address], [address], Dr. Gates Rigg.

## 2018-07-05 NOTE — Progress Notes (Signed)
Subjective: Chief Complaint  Patient presents with  . recheck    recheck ear worse pain    Here for left ear infection recheck.  I saw her 2 days ago for same.   Taking Augmentin oral, Ciprodex drops, using pain medication and nothing is helping.   Since 2 days ago, left neck is swollen, lymph nodes swollen, worse pain, ear feels worse swollen, in tears due to the pain despite pain medication.   Had this about 3 years ago after begin in ocean [place].   Has headache.   No fever, no NVD, no sinus pressure, no sore throat.  No other aggravating or relieving factors. No other complaint.   Past Medical History:  Diagnosis Date  . Anemia    Current Outpatient Medications on File Prior to Visit  Medication Sig Dispense Refill  . amoxicillin-clavulanate (AUGMENTIN) 875-125 MG tablet Take 1 tablet by mouth 2 (two) times daily. 20 tablet 0  . HYDROcodone-acetaminophen (NORCO) 5-325 MG tablet Take 1 tablet by mouth every 6 (six) hours as needed for moderate pain. 10 tablet 0  . ibuprofen (ADVIL,MOTRIN) 200 MG tablet Take 200 mg by mouth every 6 (six) hours as needed.    . neomycin-colistin-hydrocortisone-thonzonium (CORTISPORIN-TC) 3.01-15-09-0.5 MG/ML OTIC suspension Place 3 drops into the left ear 4 (four) times daily. 10 mL 0  . polyethylene glycol powder (MIRALAX) powder Take 255 g by mouth 2 (two) times daily. (Patient not taking: Reported on 07/05/2018) 255 g 0   No current facility-administered medications on file prior to visit.    ROS as in subjective    Objective: BP 120/78   Pulse 72   Temp 98.3 F (36.8 C) (Oral)   Resp 16   Ht 5\' 3"  (1.6 m)   Wt 138 lb (62.6 kg)   SpO2 98%   BMI 24.45 kg/m   Gen: wd, wn, nad, in pain Left ear canal swollen and purulent discharge present, ear and tragus and preauricular area swollen, ear canal more swollen than 2 days ago, left neck with lymph nodes tender and swollen, but neck ROM still WNL  otherwise HENT  unremarkable   Assessment: Encounter Diagnoses  Name Primary?  . Acute otitis externa of left ear, unspecified type Yes  . Cyst of ear canal      Plan: Discussed findings.   Given her worsening symptoms despite treatment, called and got her work in appt with ENT today with Dr. Gates Rigg at [facility].   Kayton was seen today for recheck.  Diagnoses and all orders for this visit:  Acute otitis externa of left ear, unspecified type  Cyst of ear canal

## 2018-07-05 NOTE — Telephone Encounter (Signed)
Pt states that her ear pain is worse. Ear is swollen. The pain causes her to cry. She is using meds she was given but it's not helping. Her out of work note through today but she does not see how she will be able to return to work with the pain she is in currently. What should she do?

## 2018-07-18 ENCOUNTER — Telehealth: Payer: Self-pay | Admitting: Medical

## 2018-07-18 NOTE — Telephone Encounter (Signed)
Pt dropped off FMLA paperwork she also put in her out of office  work not from the ENT, she states that HR told her her PCP has to fill them out, she can be reached at [phone] when ready to be picked up put in your folder

## 2018-07-19 NOTE — Telephone Encounter (Signed)
As far as I can understand 07-03-18 thru 07-11-18.

## 2018-07-19 NOTE — Telephone Encounter (Signed)
Left message on voicemail for patient to call back with dates she was out of work.

## 2018-07-19 NOTE — Telephone Encounter (Signed)
I received FMLA paperwork.  On 07/12/18 ENT wrote a note that released her back to work as long as she wasn't getting water in the ear at work.  Please confirm what dates she was out of work.   I am aware of the dates I saw her and the date of the ENT appt.

## 2018-07-21 NOTE — Telephone Encounter (Signed)
I need the form back

## 2018-07-27 ENCOUNTER — Telehealth: Payer: Self-pay

## 2018-07-27 NOTE — Telephone Encounter (Signed)
Called patient to pick up FMLA paperwork.  Paperwork is in brown folder up front.   Copies have been made.

## 2018-10-03 ENCOUNTER — Encounter: Payer: Self-pay | Admitting: Medical

## 2018-10-03 ENCOUNTER — Ambulatory Visit (INDEPENDENT_AMBULATORY_CARE_PROVIDER_SITE_OTHER): Payer: Managed Care, Other (non HMO) | Admitting: Medical

## 2018-10-03 VITALS — BP 120/70 | HR 83 | Temp 98.1°F | Resp 16 | Ht 63.0 in | Wt 139.0 lb

## 2018-10-03 DIAGNOSIS — R102 Pelvic and perineal pain: Secondary | ICD-10-CM | POA: Diagnosis not present

## 2018-10-03 DIAGNOSIS — R10817 Generalized abdominal tenderness: Secondary | ICD-10-CM

## 2018-10-03 DIAGNOSIS — M545 Low back pain, unspecified: Secondary | ICD-10-CM

## 2018-10-03 LAB — POCT URINALYSIS DIP (PROADVANTAGE DEVICE)
BILIRUBIN UA: NEGATIVE
BILIRUBIN UA: NEGATIVE mg/dL
Glucose, UA: NEGATIVE mg/dL
Leukocytes, UA: NEGATIVE
Nitrite, UA: NEGATIVE
PH UA: 6 (ref 5.0–8.0)
PROTEIN UA: NEGATIVE mg/dL
Specific Gravity, Urine: 1.015
Urobilinogen, Ur: NEGATIVE

## 2018-10-03 LAB — POCT UA - MICROSCOPIC ONLY: RBC, urine, microscopic: 1

## 2018-10-03 NOTE — Progress Notes (Signed)
Subjective: Chief Complaint  Patient presents with  . back pain    back pain, burning X 3 weeks   Here for back pain, bilateral low back pain as well as some general lower abdominal pain.   No leg pain, no leg paresthesia.  Denies injury trauma or fall.  Exercising some.  Has some lower abdominal pain intermittent for a few weeks.  She does not do strenuous exercise on the job no heavy lifting.  No urinary burning, no frequency, no urgency.  No blood or odor in urine.   No issues with BMs.  Usually daily BM but sometimes may skip a day, no diarrhea, no blood in stool.   Periods are regular, not heavy.  Not on birth control.   Not sexually active, last time 1.5 year ago.  No vaginal dishcarge, no vaginal concerns.   The back pain is daily for last 3 weeks, but lower abdominal pain sometimes.    Last pap possible health dept 2015.   LMP last week.   Past Medical History:  Diagnosis Date  . Anemia    Current Outpatient Medications on File Prior to Visit  Medication Sig Dispense Refill  . ibuprofen (ADVIL,MOTRIN) 200 MG tablet Take 200 mg by mouth every 6 (six) hours as needed.    . neomycin-colistin-hydrocortisone-thonzonium (CORTISPORIN-TC) 3.01-15-09-0.5 MG/ML OTIC suspension Place 3 drops into the left ear 4 (four) times daily. (Patient not taking: Reported on 10/03/2018) 10 mL 0  . polyethylene glycol powder (MIRALAX) powder Take 255 g by mouth 2 (two) times daily. (Patient not taking: Reported on 07/05/2018) 255 g 0   No current facility-administered medications on file prior to visit.    ROS as in subjective   Objective: BP 120/70   Pulse 83   Temp 98.1 F (36.7 C) (Oral)   Resp 16   Ht 5\' 3"  (1.6 m)   Wt 139 lb (63 kg)   LMP 09/25/2018 (Exact Date)   SpO2 100%   BMI 24.62 kg/m    General appearance: alert, no distress, WD/WN, AA female Abdomen: +bs, soft, generalized lower bilat abdominal tenderness, otherwise non tender, non distended, no masses, no hepatomegaly, no  splenomegaly Back: non tender, normal ROM, full ROM, great flexibility, no deformity Musculoskeletal: legs non tender, no swelling, no obvious deformity Extremities: no edema, no cyanosis, no clubbing Legs neurovascularly intact    Assessment: Encounter Diagnoses  Name Primary?  . Low back pain without sciatica, unspecified back pain laterality, unspecified chronicity Yes  . Generalized abdominal tenderness, rebound tenderness presence not specified   . Pelvic pain      Plan: Urinalysis reviewed.  Urine microscopic shows no significant findings.  Discussed possible causes of her back and abdominal pain.  Patient Instructions   Encounter Diagnoses  Name Primary?  . Low back pain without sciatica, unspecified back pain laterality, unspecified chronicity Yes  . Generalized abdominal tenderness, rebound tenderness presence not specified   . Pelvic pain     You were seen today for lower abdominal pain as well as back pain   The pelvic/lower belly pain is intermittent and you reported no obvious urinary or bowel issues today.  We will send the urine for culture to see if there is an infection.  This will take to 3 days to get the results  In the meantime drink plenty of water, may be drink a little cranberry juice for a few days to acidify the urine  I recommend you make a follow-up visit here for  pelvic exam with Harland Dingwall, nurse practitioner.  After your pelvic exam, it may be determined that you may need a pelvic ultrasound to further investigate your symptoms.  We will await the pelvic exam.  If you would rather, we can refer you to gynecology for further evaluation of lower belly pain and pelvic exam.  I can see you back at your convenience for a physical and routine labs  Sometimes constipation can cause bloating and belly pain.  Drink plenty of water throughout the day, try to get at least 25 g of fiber in your diet daily such as grains like wheat and oatmeal and  cereal.  Get leafy green vegetables in your diet regular.  If you get backed up or bloated you can use over-the-counter MiraLAX 1 capful daily to help with constipation  If your urine culture turns out to be normal, then my focus would be on the pelvic and belly pain which may be contributing to the back pain        Jacqueline Reyes was seen today for back pain.  Diagnoses and all orders for this visit:  Low back pain without sciatica, unspecified back pain laterality, unspecified chronicity -     POCT Urinalysis DIP (Proadvantage Device)  Generalized abdominal tenderness, rebound tenderness presence not specified  Pelvic pain

## 2018-10-03 NOTE — Patient Instructions (Addendum)
You were seen today for lower abdominal pain as well as back pain   The pelvic/lower belly pain is intermittent and you reported no obvious urinary or bowel issues today.  We will send the urine for culture to see if there is an infection.  This will take to 3 days to get the results  In the meantime drink plenty of water, may be drink a little cranberry juice for a few days to acidify the urine  I recommend you make a follow-up visit here for pelvic exam with Harland Dingwall, nurse practitioner.  After your pelvic exam, it may be determined that you may need a pelvic ultrasound to further investigate your symptoms.  We will await the pelvic exam.  If you would rather, we can refer you to gynecology for further evaluation of lower belly pain and pelvic exam.  I can see you back at your convenience for a physical and routine labs  Sometimes constipation can cause bloating and belly pain.  Drink plenty of water throughout the day, try to get at least 25 g of fiber in your diet daily such as grains like wheat and oatmeal and cereal.  Get leafy green vegetables in your diet regular.  If you get backed up or bloated you can use over-the-counter MiraLAX 1 capful daily to help with constipation  If your urine culture turns out to be normal, then my focus would be on the pelvic and belly pain which may be contributing to the back pain

## 2018-10-05 ENCOUNTER — Other Ambulatory Visit: Payer: Self-pay | Admitting: Medical

## 2018-10-05 LAB — URINE CULTURE

## 2018-10-05 MED ORDER — NITROFURANTOIN MONOHYD MACRO 100 MG PO CAPS: 100.0000 mg | ORAL_CAPSULE | Freq: Two times a day (BID) | ORAL | 0 refills | Status: AC

## 2018-10-05 NOTE — Progress Notes (Signed)
urine culture results show infection.  Begin antibiotic that was sent to pharmacy.

## 2018-10-05 NOTE — Progress Notes (Signed)
The preliminary urine culture results show infection.  Begin antibiotic that was sent to pharmacy.  We will call when final culture results are available in the next few days.

## 2018-10-11 ENCOUNTER — Encounter: Payer: Self-pay | Admitting: Family Medicine

## 2018-10-11 ENCOUNTER — Ambulatory Visit (INDEPENDENT_AMBULATORY_CARE_PROVIDER_SITE_OTHER): Payer: Managed Care, Other (non HMO) | Admitting: Family Medicine

## 2018-10-11 ENCOUNTER — Other Ambulatory Visit (HOSPITAL_COMMUNITY)
Admission: RE | Admit: 2018-10-11 | Discharge: 2018-10-11 | Disposition: A | Discharge status: Home / Self Care | Payer: Managed Care, Other (non HMO) | Source: Ambulatory Visit | Attending: Family Medicine | Admitting: Family Medicine

## 2018-10-11 VITALS — BP 122/74 | HR 78 | Temp 98.4°F | Wt 137.2 lb

## 2018-10-11 DIAGNOSIS — N898 Other specified noninflammatory disorders of vagina: Secondary | ICD-10-CM | POA: Diagnosis present

## 2018-10-11 DIAGNOSIS — Z124 Encounter for screening for malignant neoplasm of cervix: Secondary | ICD-10-CM | POA: Insufficient documentation

## 2018-10-11 NOTE — Progress Notes (Signed)
   Subjective:    Patient ID: Jacqueline Reyes, female    DOB: 11-Feb-1981, 37 y.o.   MRN: [id number]  HPI Chief Complaint  Patient presents with  . pelvic    yeast?? never had this before, doesn't itch- been going on for a month   She is here requesting a pelvic exam for vaginal discharge x 2-3 weeks and to update her pap smear. States she thinks she has a yeast infection. Denies itching or irritation. Denies being sexually active.  Last sexual encounter was 2 years ago.  Denies abdominal pain, back pain, pelvic pain, urinary symptoms.   No fever, chills, N/V/D.   Periods are regular. States her period is not lasting as long as they have in the past. LMP: 09/25/2018  Last pap smear 2015 at [place]. Normal per patient.   Single. Last sexual encounter 2 years ago.  She is not on birth control.   Reviewed allergies, medications, past medical, surgical, family, and social history.   Review of Systems Pertinent positives and negatives in the history of present illness.     Objective:   Physical Exam  Constitutional: She appears well-developed and well-nourished. No distress.  Cardiovascular: Normal rate and regular rhythm.  Pulmonary/Chest: Effort normal and breath sounds normal.  Abdominal: Soft. Normal appearance and bowel sounds are normal. There is no tenderness. There is no rigidity and no CVA tenderness.  Genitourinary: Uterus normal. There is no rash, tenderness or lesion on the right labia. There is no rash, tenderness or lesion on the left labia. Cervix exhibits no motion tenderness, no discharge and no friability. Right adnexum displays no mass, no tenderness and no fullness. Left adnexum displays no mass, no tenderness and no fullness. No tenderness in the vagina. Vaginal discharge found.  Genitourinary Comments: Scant amount of thick, white discharge in the vaginal vault.  Pap smear was done.  Chaperone present.  Skin: Skin is warm and dry.   BP 122/74    Pulse 78   Temp 98.4 F (36.9 C) (Oral)   Wt 137 lb 3.2 oz (62.2 kg)   LMP 09/25/2018 (Exact Date)   BMI 24.30 kg/m      Assessment & Plan:  Encounter for Papanicolaou smear for cervical cancer screening - Plan: Cytology - PAP([place])  Vaginal discharge - Plan: Cytology - PAP([place])  Pap smear done.  Denies history of abnormal Pap smears.  Last one was done at the [facility] and I will try to obtain this result. We will screen for BV, yeast, gonorrhea and chlamydia.  No recent sexual activity. Periods are regular however she does feel that the past 2 have not been as long as usual for her. Follow-up with her PCP going forward

## 2018-10-17 LAB — CYTOLOGY - PAP
BACTERIAL VAGINITIS: NEGATIVE
Candida vaginitis: NEGATIVE
Chlamydia: NEGATIVE
DIAGNOSIS: NEGATIVE
HPV: NOT DETECTED
NEISSERIA GONORRHEA: NEGATIVE
Trichomonas: NEGATIVE

## 2018-10-18 ENCOUNTER — Encounter: Payer: Self-pay | Admitting: Internal Medicine

## 2018-10-19 ENCOUNTER — Telehealth: Payer: Self-pay | Admitting: Internal Medicine

## 2018-10-19 NOTE — Telephone Encounter (Signed)
Pt called and states that she wants an ultrasound done as she is still having belly pain. Please advise as vickie did not put this in her note

## 2018-10-19 NOTE — Telephone Encounter (Signed)
When I saw her her urine culture was positive for infection I treated her with antibiotic.  So if she is still having symptoms despite the pelvic exam and other test that Vickie performed, then she probably needs to come back for a repeat urine first to make sure not ongoing urinary infection    If that turns out to be negative then she can have an abdominal and  pelvic ultrasound

## 2018-10-19 NOTE — Telephone Encounter (Signed)
Left message for pt to call me back 

## 2018-10-20 ENCOUNTER — Other Ambulatory Visit: Payer: Self-pay | Admitting: Internal Medicine

## 2018-10-20 DIAGNOSIS — R109 Unspecified abdominal pain: Secondary | ICD-10-CM

## 2018-10-20 DIAGNOSIS — R10817 Generalized abdominal tenderness: Secondary | ICD-10-CM

## 2018-10-20 NOTE — Telephone Encounter (Signed)
Pt was notified. She will come in Monday. Once we get results we will go from there

## 2018-10-23 ENCOUNTER — Telehealth: Payer: Self-pay

## 2018-10-23 ENCOUNTER — Other Ambulatory Visit (INDEPENDENT_AMBULATORY_CARE_PROVIDER_SITE_OTHER): Payer: Managed Care, Other (non HMO)

## 2018-10-23 ENCOUNTER — Other Ambulatory Visit: Payer: Self-pay | Admitting: Medical

## 2018-10-23 DIAGNOSIS — R109 Unspecified abdominal pain: Secondary | ICD-10-CM

## 2018-10-23 DIAGNOSIS — R102 Pelvic and perineal pain: Secondary | ICD-10-CM

## 2018-10-23 DIAGNOSIS — R10817 Generalized abdominal tenderness: Secondary | ICD-10-CM

## 2018-10-23 LAB — POCT URINALYSIS DIP (PROADVANTAGE DEVICE)
Bilirubin, UA: NEGATIVE
Glucose, UA: NEGATIVE mg/dL
Ketones, POC UA: NEGATIVE mg/dL
Leukocytes, UA: NEGATIVE
Nitrite, UA: NEGATIVE
PROTEIN UA: NEGATIVE mg/dL
SPECIFIC GRAVITY, URINE: 1.015
Urobilinogen, Ur: 3.5
pH, UA: 7 (ref 5.0–8.0)

## 2018-10-23 NOTE — Telephone Encounter (Signed)
Please advise on Pt U/A recheck. Large blood but pt is on her menstrual cycle. [place]

## 2018-12-01 ENCOUNTER — Ambulatory Visit
Admission: RE | Admit: 2018-12-01 | Discharge: 2018-12-01 | Disposition: A | Discharge status: Home / Self Care | Payer: Managed Care, Other (non HMO) | Source: Ambulatory Visit | Attending: Medical | Admitting: Medical

## 2018-12-01 DIAGNOSIS — R102 Pelvic and perineal pain: Secondary | ICD-10-CM

## 2018-12-01 DIAGNOSIS — R109 Unspecified abdominal pain: Secondary | ICD-10-CM

## 2018-12-04 ENCOUNTER — Other Ambulatory Visit: Payer: Self-pay

## 2018-12-04 DIAGNOSIS — R109 Unspecified abdominal pain: Secondary | ICD-10-CM

## 2018-12-19 ENCOUNTER — Ambulatory Visit (INDEPENDENT_AMBULATORY_CARE_PROVIDER_SITE_OTHER): Payer: PRIVATE HEALTH INSURANCE | Admitting: Obstetrics & Gynecology

## 2018-12-19 ENCOUNTER — Encounter: Payer: Self-pay | Admitting: Obstetrics & Gynecology

## 2018-12-19 VITALS — BP 132/90 | Ht 60.0 in | Wt 135.0 lb

## 2018-12-19 DIAGNOSIS — Z01419 Encounter for gynecological examination (general) (routine) without abnormal findings: Secondary | ICD-10-CM

## 2018-12-19 DIAGNOSIS — D219 Benign neoplasm of connective and other soft tissue, unspecified: Secondary | ICD-10-CM | POA: Diagnosis not present

## 2018-12-19 DIAGNOSIS — R102 Pelvic and perineal pain: Secondary | ICD-10-CM | POA: Diagnosis not present

## 2018-12-19 LAB — CBC
HCT: 36.9 % (ref 35.0–45.0)
Hemoglobin: 12.3 g/dL (ref 11.7–15.5)
MCH: 28.5 pg (ref 27.0–33.0)
MCHC: 33.3 g/dL (ref 32.0–36.0)
MCV: 85.4 fL (ref 80.0–100.0)
MPV: 10 fL (ref 7.5–12.5)
Platelets: 460 10*3/uL — ABNORMAL HIGH (ref 140–400)
RBC: 4.32 10*6/uL (ref 3.80–5.10)
RDW: 12.6 % (ref 11.0–15.0)
WBC: 6 10*3/uL (ref 3.8–10.8)

## 2018-12-19 NOTE — Progress Notes (Signed)
Jacqueline Reyes 08-16-1981 [id number]   History:    38 y.o. G0 Single  RP:  New patient presenting for annual gyn exam   HPI:  Menses regular normal every month.  Flow slightly less x last 3 months.  No BTB.  C/O pelvic cramping with menses.  Had a pelvic US 11/2018, per patient, doesn't know the results.  Not sexually active x 2 years.  Breasts normal.  BMI 26.37.  Health labs with Fam MD.    Past medical history,surgical history, family history and social history were all reviewed and documented in the EPIC chart.  Gynecologic History Patient's last menstrual period was 12/15/2018. Contraception: abstinence Last Pap:  09/2018 Negative Last mammogram: Never Bone Density: Never Colonoscopy: Never  Obstetric History OB History  Gravida Para Term Preterm AB Living  0 0 0 0 0 0  SAB TAB Ectopic Multiple Live Births  0 0 0 0 0     ROS: A ROS was performed and pertinent positives and negatives are included in the history.  GENERAL: No fevers or chills. HEENT: No change in vision, no earache, sore throat or sinus congestion. NECK: No pain or stiffness. CARDIOVASCULAR: No chest pain or pressure. No palpitations. PULMONARY: No shortness of breath, cough or wheeze. GASTROINTESTINAL: No abdominal pain, nausea, vomiting or diarrhea, melena or bright red blood per rectum. GENITOURINARY: No urinary frequency, urgency, hesitancy or dysuria. MUSCULOSKELETAL: No joint or muscle pain, no back pain, no recent trauma. DERMATOLOGIC: No rash, no itching, no lesions. ENDOCRINE: No polyuria, polydipsia, no heat or cold intolerance. No recent change in weight. HEMATOLOGICAL: No anemia or easy bruising or bleeding. NEUROLOGIC: No headache, seizures, numbness, tingling or weakness. PSYCHIATRIC: No depression, no loss of interest in normal activity or change in sleep pattern.     Exam:   BP 132/90   Ht 5' (1.524 m)   Wt 135 lb (61.2 kg)   LMP 12/15/2018   BMI 26.37 kg/m   Body mass index is 26.37  kg/m.  General appearance : Well developed well nourished female. No acute distress HEENT: Eyes: no retinal hemorrhage or exudates,  Neck supple, trachea midline, no carotid bruits, no thyroidmegaly Lungs: Clear to auscultation, no rhonchi or wheezes, or rib retractions  Heart: Regular rate and rhythm, no murmurs or gallops Breast:Examined in sitting and supine position were symmetrical in appearance, no palpable masses or tenderness,  no skin retraction, no nipple inversion, no nipple discharge, no skin discoloration, no axillary or supraclavicular lymphadenopathy Abdomen: no palpable masses or tenderness, no rebound or guarding Extremities: no edema or skin discoloration or tenderness  Pelvic: Vulva: Normal             Vagina: No gross lesions or discharge  Cervix: No gross lesions or discharge.  Pap/HPV HR/Gono-Chlam done  Uterus  AV, mild increase in size, nodular, non-tender and mobile  Adnexa  Without masses or tenderness  Anus: Normal  Pelvic US 12/01/2018:  Uterus: Measurements: 10.0 x 4.7 x 6.9 cm = volume: 169 mL. 2.8 x 2.6 x 2.8 cm intramural fibroid present at the uterine fundus. 3.6 x 2.1 x 2.7 cm intramural fibroid present at the mid left anterior uterine body. 2.1 x 1.8 x 1.8 cm intramural fibroid present at the right posterior mid uterine body. Endometrium: Thickness: 8.7 mm.  No focal abnormality visualized. Right ovary: Measurements: 3.3 x 0.9 x 2.1 cm = volume: 3.3 mL. Normal appearance/no adnexal mass. Left ovary: Measurements: 3.5 x 2.0 x 2.3 cm = volume:  8.4 mL. 2.3 x 1.5 x 1.5 cm simple exophytic left ovarian cyst, most consistent with a normal physiologic cyst/dominant follicle. Trace free physiologic fluid within the pelvis.    Assessment/Plan:  38 y.o. female for annual exam   1. Encounter for routine gynecological examination with Papanicolaou smear of cervix Gynecologic exam with mildly increased size of uterus with fibroids.  Pap with high-risk HPV done today.   Breast exam normal.  Body mass index 26.37.  Encouraged to exercise regularly and continue with a healthy nutrition.  2. Pelvic pain in female Mild dysmenorrhea.  Recommend ibuprofen as needed during the menstrual periods.  Given the presence of fibroids, will check a CBC today to rule out anemia.  We will follow-up with a repeat pelvic ultrasound to reevaluate the uterine fibroids and ovaries in 4 to 6 months. - US Transvaginal Non-OB; Future - CBC  3. Fibroids Counseling on fibroids done including diagnosis and management.  CBC today and repeat pelvic ultrasound in 4 to 6 months. - US Transvaginal Non-OB; Future - CBC  Counseling on above issues and coordination of care more than 50% for 20 minutes.  Princess Bruins MD, 11:35 AM 12/19/2018

## 2018-12-20 ENCOUNTER — Encounter: Payer: Self-pay | Admitting: Obstetrics & Gynecology

## 2018-12-20 LAB — PAP IG, CT-NG NAA, HPV HIGH-RISK
C. trachomatis RNA, TMA: NOT DETECTED
HPV DNA HIGH RISK: NOT DETECTED
N. gonorrhoeae RNA, TMA: NOT DETECTED

## 2018-12-20 NOTE — Patient Instructions (Signed)
1. Encounter for routine gynecological examination with Papanicolaou smear of cervix Gynecologic exam with mildly increased size of uterus with fibroids.  Pap with high-risk HPV done today.  Breast exam normal.  Body mass index 26.37.  Encouraged to exercise regularly and continue with a healthy nutrition.  2. Pelvic pain in female Mild dysmenorrhea.  Recommend ibuprofen as needed during the menstrual periods.  Given the presence of fibroids, will check a CBC today to rule out anemia.  We will follow-up with a repeat pelvic ultrasound to reevaluate the uterine fibroids and ovaries in 4 to 6 months. - US Transvaginal Non-OB; Future - CBC  3. Fibroids Counseling on fibroids done including diagnosis and management.  CBC today and repeat pelvic ultrasound in 4 to 6 months. - US Transvaginal Non-OB; Future - CBC  Jacqueline Reyes, it was a pleasure meeting you today!  I will inform you of your results as soon as they are available.

## 2019-05-23 ENCOUNTER — Ambulatory Visit: Payer: PRIVATE HEALTH INSURANCE | Admitting: Obstetrics & Gynecology

## 2019-05-23 ENCOUNTER — Other Ambulatory Visit: Payer: PRIVATE HEALTH INSURANCE

## 2020-12-18 IMAGING — US US PELVIS COMPLETE WITH TRANSVAGINAL
2 series · 13 of 25 positions shown · non-contrast
Comparison: None

CLINICAL DATA: Initial evaluation for pelvic and abdominal pain for
2 months.



[Series 1: us pelvis complete with transvaginal · 0.30mm/px · 12 of 51 slices shown (1 of 2)]
[im 1/51]
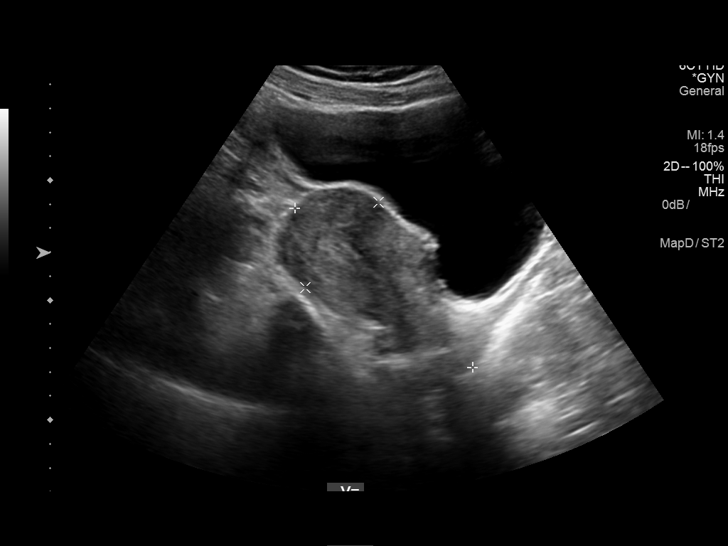
[im 5/51]
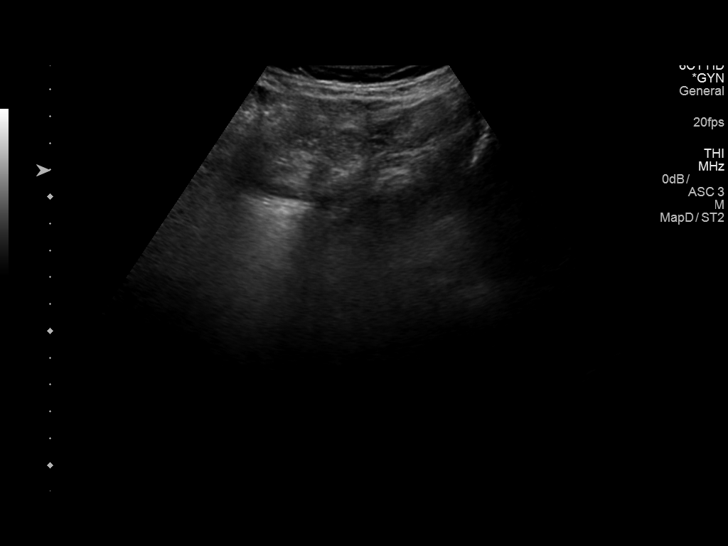
[im 10/51]
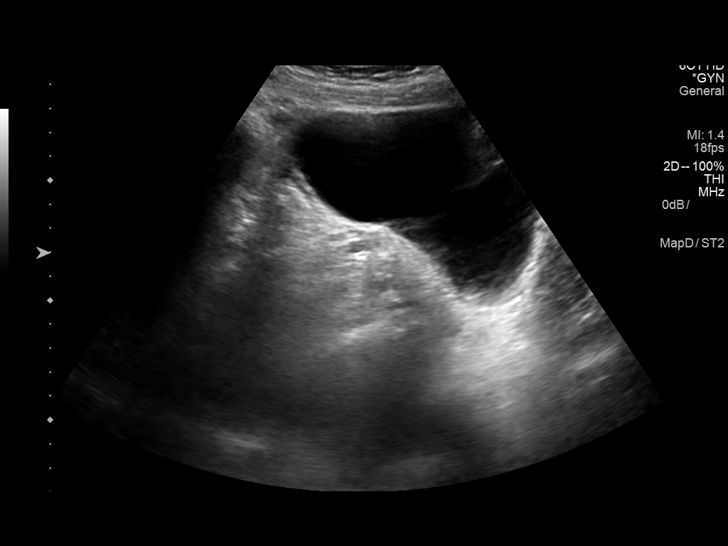
[im 14/51]
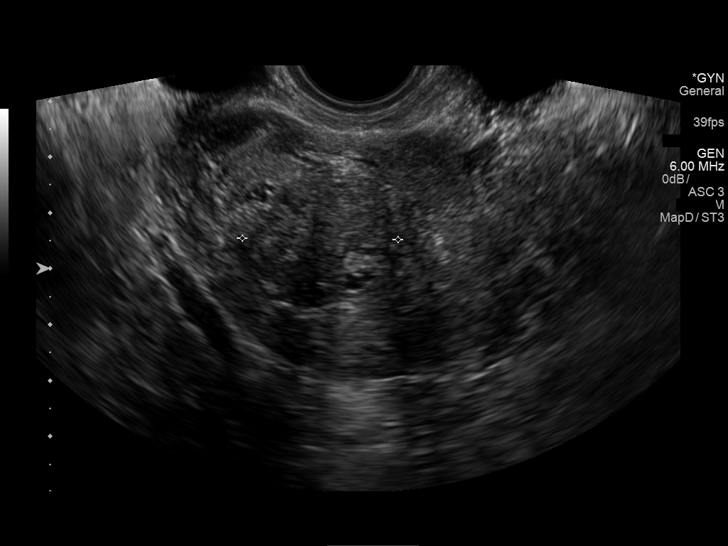
[im 19/51]
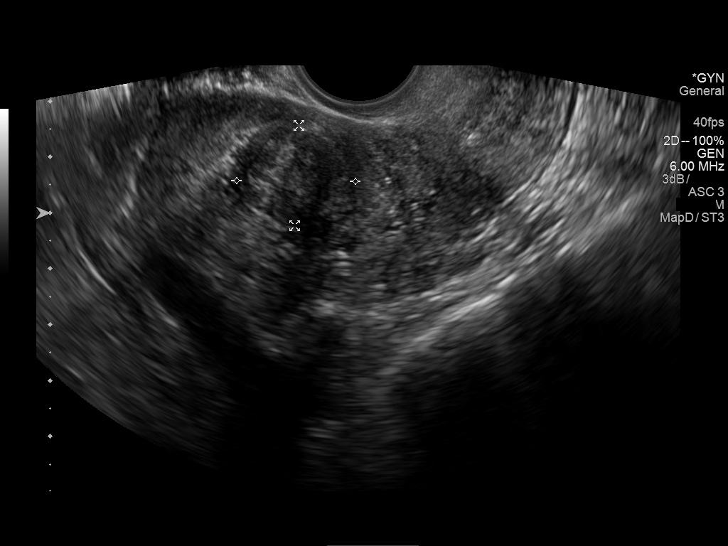
[im 23/51]
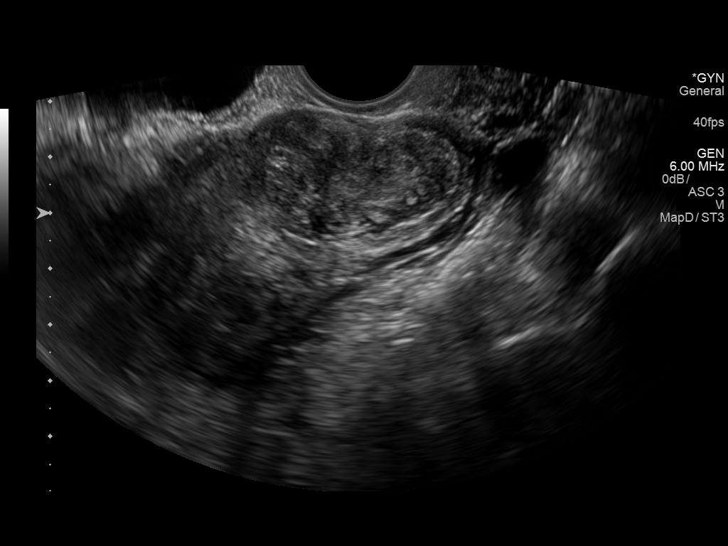
[im 28/51]
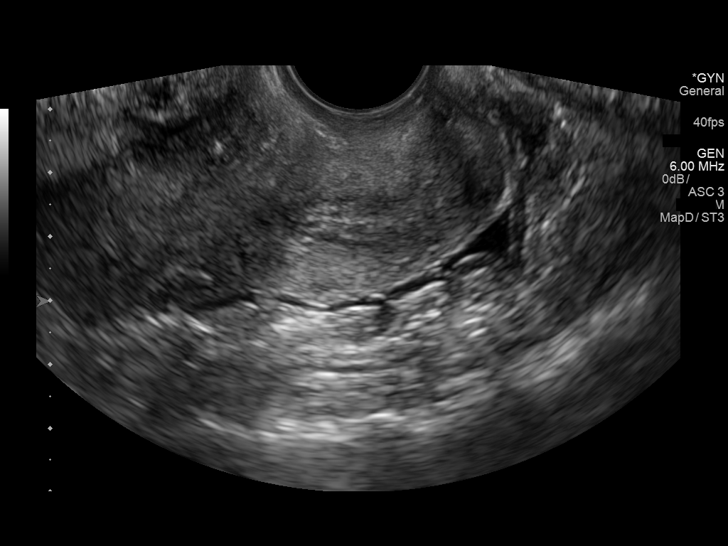
[im 32/51]
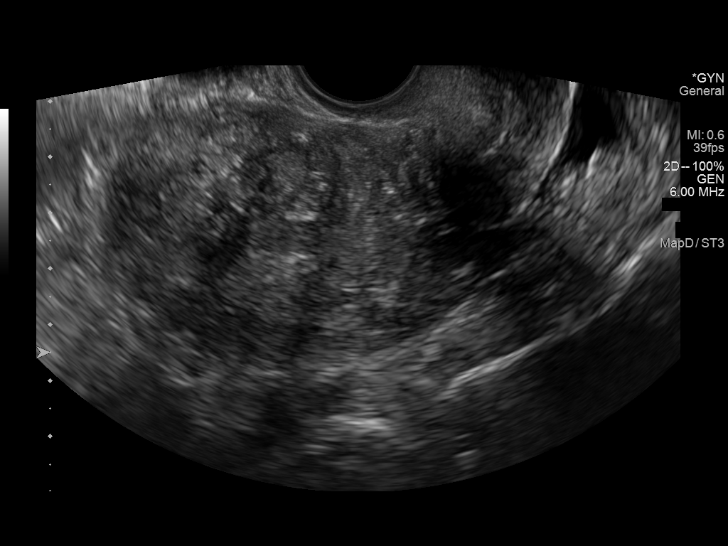
[im 37/51]
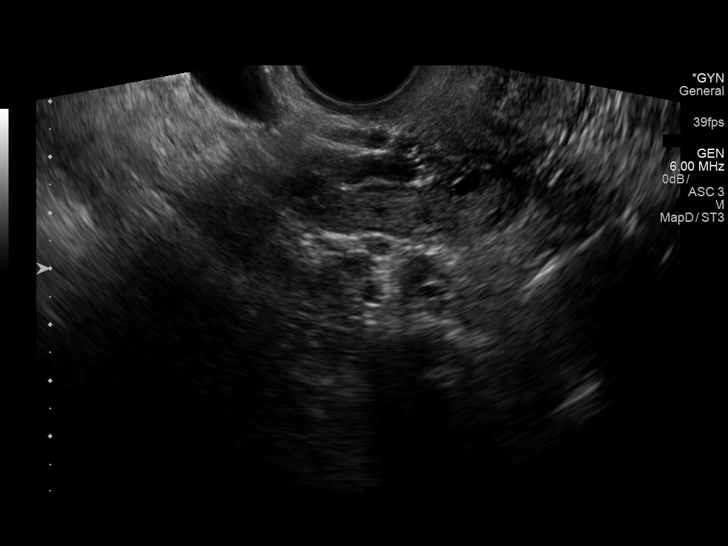
[im 41/51]
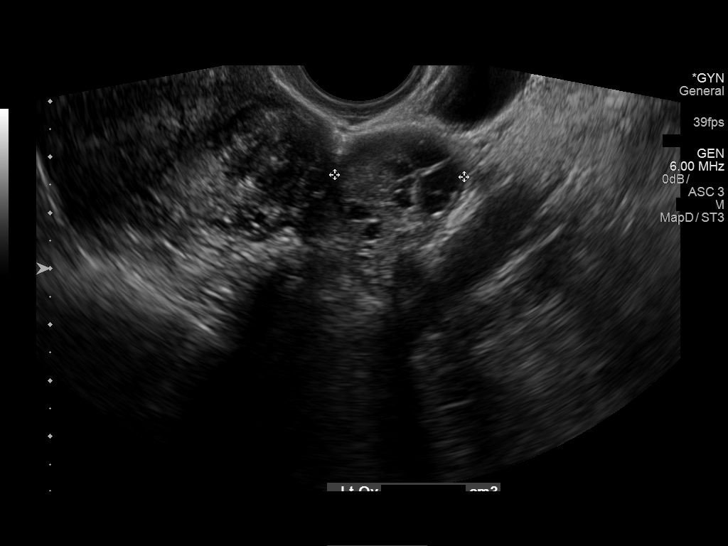
[im 46/51]
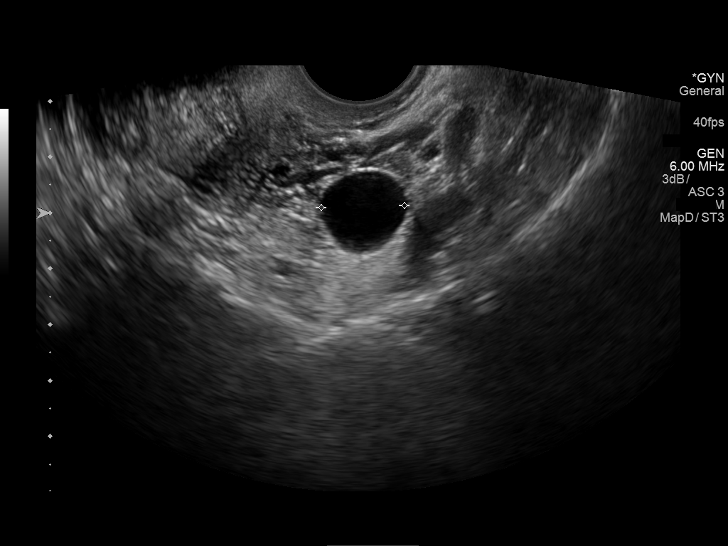
[im 51/51]
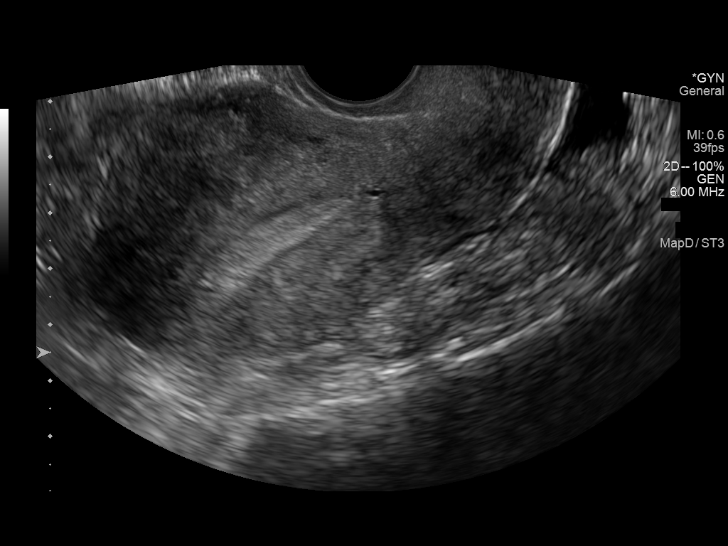

[Series 1001: us pelvis complete with transvaginal · 0.13mm/px · 1 of 4 slices shown (2 of 2)]
[im 4/4]
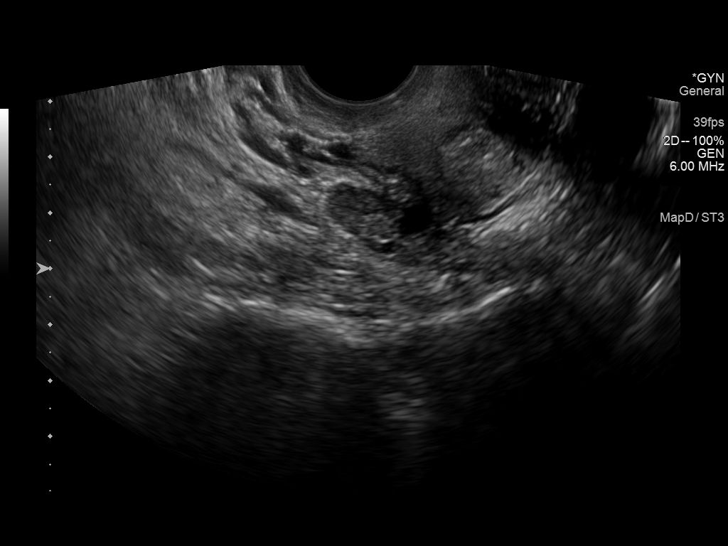

[13 of 25 positions shown; findings below may reference images not displayed]

FINDINGS: Uterus

Measurements: 10.0 x 4.7 x 6.9 cm = volume: 169 mL. 2.8 x 2.6 x
cm intramural fibroid present at the uterine fundus. 3.6 x 2.1 x
cm intramural fibroid present at the mid left anterior uterine body.
2.1 x 1.8 x 1.8 cm intramural fibroid present at the right posterior
mid uterine body.

Endometrium

Thickness: 8.7 mm.  No focal abnormality visualized.

Right ovary

Measurements: 3.3 x 0.9 x 2.1 cm = volume: 3.3 mL. Normal
appearance/no adnexal mass.

Left ovary

Measurements: 3.5 x 2.0 x 2.3 cm = volume: 8.4 mL. 2.3 x 1.5 x
cm simple exophytic left ovarian cyst, most consistent with a normal
physiologic cyst/dominant follicle.

Other findings

Trace free physiologic fluid within the pelvis.
IMPRESSION: 1. Fibroid uterus as detailed above.
2. 2.3 cm simple left ovarian cyst, most consistent with a normal
physiologic follicular cyst.
3. Otherwise unremarkable and normal pelvic ultrasound.
